# Patient Record
Sex: Male | Born: 1965 | Hispanic: Yes | Smoking: Never smoker
Health system: Southern US, Community
[De-identification: ages and names within clinical notes are randomized; demographics above are authoritative.]

## PROBLEM LIST (undated history)

## (undated) DIAGNOSIS — F250 Schizoaffective disorder, bipolar type: Secondary | ICD-10-CM

## (undated) DIAGNOSIS — F419 Anxiety disorder, unspecified: Secondary | ICD-10-CM

---

## 2015-09-21 ENCOUNTER — Emergency Department (HOSPITAL_COMMUNITY)
Admission: EM | Admit: 2015-09-21 | Discharge: 2015-09-23 | Disposition: A | Discharge status: Other Acute Inpatient Hospital | Payer: Federal, State, Local not specified - Other | Attending: Emergency Medicine | Admitting: Emergency Medicine

## 2015-09-21 ENCOUNTER — Emergency Department (HOSPITAL_COMMUNITY): Payer: Self-pay

## 2015-09-21 DIAGNOSIS — R740 Nonspecific elevation of levels of transaminase and lactic acid dehydrogenase [LDH]: Secondary | ICD-10-CM | POA: Insufficient documentation

## 2015-09-21 DIAGNOSIS — E876 Hypokalemia: Secondary | ICD-10-CM | POA: Insufficient documentation

## 2015-09-21 DIAGNOSIS — K8020 Calculus of gallbladder without cholecystitis without obstruction: Secondary | ICD-10-CM | POA: Insufficient documentation

## 2015-09-21 DIAGNOSIS — F250 Schizoaffective disorder, bipolar type: Secondary | ICD-10-CM | POA: Insufficient documentation

## 2015-09-21 DIAGNOSIS — I10 Essential (primary) hypertension: Secondary | ICD-10-CM | POA: Insufficient documentation

## 2015-09-21 DIAGNOSIS — Z79899 Other long term (current) drug therapy: Secondary | ICD-10-CM | POA: Insufficient documentation

## 2015-09-21 DIAGNOSIS — R7401 Elevation of levels of liver transaminase levels: Secondary | ICD-10-CM

## 2015-09-21 LAB — COMPREHENSIVE METABOLIC PANEL
ALT: 175 U/L — AB (ref 17–63)
AST: 197 U/L — AB (ref 15–41)
Albumin: 4 g/dL (ref 3.5–5.0)
Alkaline Phosphatase: 104 U/L (ref 38–126)
Anion gap: 11 (ref 5–15)
BILIRUBIN TOTAL: 1.8 mg/dL — AB (ref 0.3–1.2)
BUN: 12 mg/dL (ref 6–20)
CO2: 24 mmol/L (ref 22–32)
CREATININE: 1.1 mg/dL (ref 0.61–1.24)
Calcium: 9.4 mg/dL (ref 8.9–10.3)
Chloride: 101 mmol/L (ref 101–111)
GFR calc Af Amer: >60 mL/min (ref >60)
Glucose, Bld: 128 mg/dL — ABNORMAL HIGH (ref 65–99)
Potassium: 3.2 mmol/L — ABNORMAL LOW (ref 3.5–5.1)
Sodium: 136 mmol/L (ref 135–145)
TOTAL PROTEIN: 9.9 g/dL — AB (ref 6.5–8.1)

## 2015-09-21 LAB — CBC WITH DIFFERENTIAL/PLATELET
BASOS ABS: 0 10*3/uL (ref 0.0–0.1)
BASOS PCT: 0 %
EOS ABS: 0.3 10*3/uL (ref 0.0–0.7)
Eosinophils Relative: 2 %
HEMATOCRIT: 52.9 % — AB (ref 39.0–52.0)
Hemoglobin: 18.5 g/dL — ABNORMAL HIGH (ref 13.0–17.0)
LYMPHS ABS: 4.1 10*3/uL — AB (ref 0.7–4.0)
Lymphocytes Relative: 27 %
MCH: 32.8 pg (ref 26.0–34.0)
MCHC: 35 g/dL (ref 30.0–36.0)
MCV: 93.8 fL (ref 78.0–100.0)
MONOS PCT: 9 %
Monocytes Absolute: 1.4 10*3/uL — ABNORMAL HIGH (ref 0.1–1.0)
NEUTROS ABS: 9.4 10*3/uL — AB (ref 1.7–7.7)
Neutrophils Relative %: 62 %
Platelets: 277 10*3/uL (ref 150–400)
RBC: 5.64 MIL/uL (ref 4.22–5.81)
RDW: 12.9 % (ref 11.5–15.5)
WBC: 15.2 10*3/uL — ABNORMAL HIGH (ref 4.0–10.5)

## 2015-09-21 LAB — ACETAMINOPHEN LEVEL

## 2015-09-21 LAB — SALICYLATE LEVEL: Salicylate Lvl: <4 mg/dL (ref 2.8–30.0)

## 2015-09-21 LAB — ETHANOL

## 2015-09-21 MED ORDER — HALOPERIDOL LACTATE 5 MG/ML IJ SOLN
5.0000 mg | Freq: Once | INTRAMUSCULAR | Status: AC
  Administered 2015-09-21: 5 mg via INTRAMUSCULAR
  Filled 2015-09-21: qty 1

## 2015-09-21 MED ORDER — MAGNESIUM OXIDE 400 (241.3 MG) MG PO TABS
400.0000 mg | ORAL_TABLET | Freq: Once | ORAL | Status: AC
  Administered 2015-09-22: 400 mg via ORAL
  Filled 2015-09-21: qty 1

## 2015-09-21 MED ORDER — MAGNESIUM SULFATE 2 GM/50ML IV SOLN: 2.0000 g | Freq: Once | INTRAVENOUS | Status: DC

## 2015-09-21 MED ORDER — DIPHENHYDRAMINE HCL 50 MG/ML IJ SOLN
25.0000 mg | Freq: Once | INTRAMUSCULAR | Status: AC
  Administered 2015-09-21: 25 mg via INTRAVENOUS
  Filled 2015-09-21: qty 1

## 2015-09-21 MED ORDER — LORAZEPAM 2 MG/ML IJ SOLN
2.0000 mg | Freq: Once | INTRAMUSCULAR | Status: AC
  Administered 2015-09-21: 2 mg via INTRAMUSCULAR
  Filled 2015-09-21: qty 1

## 2015-09-21 MED ORDER — POTASSIUM CHLORIDE CRYS ER 20 MEQ PO TBCR
40.0000 meq | EXTENDED_RELEASE_TABLET | Freq: Once | ORAL | Status: AC
  Administered 2015-09-22: 40 meq via ORAL
  Filled 2015-09-21: qty 2

## 2015-09-21 NOTE — ED Provider Notes (Addendum)
[facility]-EM[facility]Y DEPT Provider Note   CSN: [id number] Arrival date & time: 09/21/15  1955     History   Chief Complaint Chief Complaint  Patient presents with  . Manic Behavior  . Paranoid    HPI Scott Manning is a 50 y.o. male.  HPI 50 year old male with past medical history of schizophrenia who presents with increasing aggressive behavior homicidal ideations and hallucinations. Patient is brought in by his wife she states that over the last several days patient has had increasing thoughts of homicidal ideation, aggressiveness, and hallucinations with responding to internal stimuli. She notified his psychiatrist who advised him to present to the emergency department for evaluation. [facility] and she brought him here for evaluation. Currently, the patient is unwilling or unable to provide further history due to acute agitation.  Remainder of history, ROS, and physical exam limited due to patient's AGITATION, AMS.   No past medical history on file.  There are no active problems to display for this patient.   No past surgical history on file.     Home Medications    Prior to Admission medications   Medication Sig Start Date End Date Taking? Authorizing Provider  DiphenhydrAMINE HCl (ZZZQUIL) 50 MG/30ML LIQD Take 50 mg by mouth at bedtime as needed (for sleep).   Yes Historical Provider, MD  divalproex (DEPAKOTE ER) 250 MG 24 hr tablet Take 250 mg by mouth 2 (two) times daily.   Yes Historical Provider, MD  OVER THE COUNTER MEDICATION Inject 1 application into the muscle every 30 (thirty) days.   Yes Historical Provider, MD  prazosin (MINIPRESS) 1 MG capsule Take 1 mg by mouth at bedtime.   Yes Historical Provider, MD    Family History No family history on file.  Social History Social History  Substance Use Topics  . Smoking status: Not on file  . Smokeless tobacco: Not on file  . Alcohol use Not on file     Allergies     Review of patient's allergies indicates not on file.   Review of Systems Review of Systems  Unable to perform ROS: Psychiatric disorder     Physical Exam Updated Vital Signs BP (!) 142/113 (BP Location: Right Arm)   Pulse 102   Temp 98.5 F (36.9 C) (Oral)   Resp 17   SpO2 94%   Physical Exam  Constitutional: He appears well-developed and well-nourished. He appears distressed.  HENT:  Head: Normocephalic and atraumatic.  Mouth/Throat: Oropharynx is clear and moist. No oropharyngeal exudate.  Eyes: Conjunctivae are normal. Pupils are equal, round, and reactive to light.  Neck: Neck supple.  Cardiovascular: Normal rate, regular rhythm and normal heart sounds.  Exam reveals no friction rub.   No murmur heard. Pulmonary/Chest: Effort normal and breath sounds normal. No respiratory distress. He has no wheezes. He has no rales.  Abdominal: Soft. He exhibits no distension. There is no tenderness.  Musculoskeletal: He exhibits no edema.  Neurological: He is alert. No sensory deficit. He exhibits normal muscle tone.  Face is symmetric. Extraocular movements appear intact. Strength is at least antigravity in bilateral upper and lower extremities. Gait is normal.  Skin: Skin is warm. Capillary refill takes less than 2 seconds. No pallor.  Psychiatric:  Agitated, cursing at staff and examiner. Occasionally he appears to respond to internal stimuli.  Nursing note and vitals reviewed.    ED Treatments / Results  Labs (all labs ordered are listed, but only abnormal  results are displayed) Labs Reviewed  COMPREHENSIVE METABOLIC PANEL - Abnormal; Notable for the following:       Result Value   Potassium 3.2 (*)    Glucose, Bld 128 (*)    Total Protein 9.9 (*)    AST 197 (*)    ALT 175 (*)    Total Bilirubin 1.8 (*)    All other components within normal limits  CBC WITH DIFFERENTIAL/PLATELET - Abnormal; Notable for the following:    WBC 15.2 (*)    Hemoglobin 18.5 (*)    HCT  52.9 (*)    Neutro Abs 9.4 (*)    Lymphs Abs 4.1 (*)    Monocytes Absolute 1.4 (*)    All other components within normal limits  ACETAMINOPHEN LEVEL - Abnormal; Notable for the following:    Acetaminophen (Tylenol), Serum <10 (*)    All other components within normal limits  VALPROIC ACID LEVEL - Abnormal; Notable for the following:    Valproic Acid Lvl <10 (*)    All other components within normal limits  ETHANOL  SALICYLATE LEVEL  URINE RAPID DRUG SCREEN, HOSP PERFORMED  URINALYSIS, ROUTINE W REFLEX MICROSCOPIC (NOT AT [facility])    EKG  EKG Interpretation  Date/Time:  Monday September 21 2015 21:16:41 EDT Ventricular Rate:  126 PR Interval:    QRS Duration: 74 QT Interval:  339 QTC Calculation: 491 R Axis:   43 Text Interpretation:  Sinus tachycardia Atrial premature complex Borderline prolonged QT interval No old tracing to compare Confirmed by Kamala Kolton MD, Sheria Lang [phone]) on 09/22/2015 3:56:11 AM       Radiology No results found.  Procedures Procedures (including critical care time)  Medications Ordered in ED Medications  potassium chloride SA (K-DUR,KLOR-CON) CR tablet 40 mEq (0 mEq Oral Hold 09/21/15 2300)  magnesium oxide (MAG-OX) tablet 400 mg (0 mg Oral Hold 09/21/15 2300)  haloperidol lactate (HALDOL) injection 5 mg (5 mg Intramuscular Given 09/21/15 2025)  diphenhydrAMINE (BENADRYL) injection 25 mg (25 mg Intravenous Given 09/21/15 2025)  LORazepam (ATIVAN) injection 2 mg (2 mg Intramuscular Given 09/21/15 2025)     Initial Impression / Assessment and Plan / ED Course  I have reviewed the triage vital signs and the nursing notes.  Pertinent labs & imaging results that were available during my care of the patient were reviewed by me and considered in my medical decision making (see chart for details).  Clinical Course    50 year old male with past medical history schizophrenia who presents with acute agitation and aggression and active hallucinations. On arrival,  patient is combative. He appears to be occasionally responding to internal stimuli. At this time, based on my interaction with the patient and discussion with the patient's wife, I'm concerned for acute psychiatric decompensation in setting of medication nonadherence with possible substance abuse. I believe he poses a significant risk of getting to himself and/or others and merits IVC paperwork. Otherwise, will send psychiatric screening labs. He has no focal neurological deficits or signs of head trauma.  Lab work reviewed. Mild leukocytosis is likely reactive in setting of aggression, agitation and restraint. No fevers or signs of sepsis. He has had no HA, neck stiffness, is o/w well appearing and I do not suspect meningitis or encephalitis. Otherwise, pt does have mild transaminitis - possibly 22 underlying liver disease, EtOH use. RUQ U/S obtained and is negative for cholecystitis and abdomen soft on my exam - likely incidental, will need f/u as outpt.  Otherwise, pt calm but continues hallucinating  on exam. VSS. He is hypertensive but unclear if this is due to underlying HTN versus agitation and mania - will need outpt follow-up but o/w medically stable for psych evaluation. TTS consult ordered.  Final Clinical Impressions(s) / ED Diagnoses   Final diagnoses:  Transaminitis  Hypokalemia      Shaune Pollack, MD 09/22/15 1236

## 2015-09-21 NOTE — ED Notes (Signed)
Pt making repeated statements "I want to die".

## 2015-09-21 NOTE — ED Notes (Addendum)
Pt arrived to room from triage, combative with staff, [employer] at bedside and have placed patient in handcuffs. MD Issac evaluated patient in triage, IVC paperwork completed and faxed. Pt assisted to bed, chemical restraints given, pt will be taken out of handcuffs and changed into hard restraints once patient is stable and not attempting to hit staff. Pt remains alert, cussing at staff, moving all extremities.

## 2015-09-22 ENCOUNTER — Inpatient Hospital Stay (HOSPITAL_COMMUNITY): Admission: AD | Admit: 2015-09-22 | Payer: Federal, State, Local not specified - Other | Admitting: Psychiatry

## 2015-09-22 LAB — VALPROIC ACID LEVEL: Valproic Acid Lvl: <10 ug/mL — ABNORMAL LOW (ref 50.0–100.0)

## 2015-09-22 MED ORDER — LORAZEPAM 1 MG PO TABS: 0.0000 mg | ORAL_TABLET | Freq: Two times a day (BID) | ORAL | Status: DC

## 2015-09-22 MED ORDER — LORAZEPAM 1 MG PO TABS
0.0000 mg | ORAL_TABLET | Freq: Four times a day (QID) | ORAL | Status: DC
  Administered 2015-09-23: 1 mg via ORAL
  Filled 2015-09-22: qty 1

## 2015-09-22 NOTE — ED Provider Notes (Signed)
[facility]. Discussed with Dr. Albina BilletGoldston   Lalah Durango, MD 09/22/15 1052

## 2015-09-22 NOTE — ED Provider Notes (Signed)
Patient alert pleasant cooperative asymptomatic. Denies shortness of breath or pain anywhere. [facility] Results for orders placed or performed during the hospital encounter of 09/21/15  Comprehensive metabolic panel  Result Value Ref Range   Sodium 136 135 - 145 mmol/L   Potassium 3.2 (L) 3.5 - 5.1 mmol/L   Chloride 101 101 - 111 mmol/L   CO2 24 22 - 32 mmol/L   Glucose, Bld 128 (H) 65 - 99 mg/dL   BUN 12 6 - 20 mg/dL   Creatinine, Ser 9.601.10 0.61 - 1.24 mg/dL   Calcium 9.4 8.9 - 45.410.3 mg/dL   Total Protein 9.9 (H) 6.5 - 8.1 g/dL   Albumin 4.0 3.5 - 5.0 g/dL   AST 098197 (H) 15 - 41 U/L   ALT 175 (H) 17 - 63 U/L   Alkaline Phosphatase 104 38 - 126 U/L   Total Bilirubin 1.8 (H) 0.3 - 1.2 mg/dL   GFR calc non Af Amer >60 >60 mL/min   GFR calc Af Amer >60 >60 mL/min   Anion gap 11 5 - 15  Ethanol  Result Value Ref Range   Alcohol, Ethyl (B) <5 <5 mg/dL  CBC with Diff  Result Value Ref Range   WBC 15.2 (H) 4.0 - 10.5 K/uL   RBC 5.64 4.22 - 5.81 MIL/uL   Hemoglobin 18.5 (H) 13.0 - 17.0 g/dL   HCT 11.952.9 (H) 14.739.0 - 82.952.0 %   MCV 93.8 78.0 - 100.0 fL   MCH 32.8 26.0 - 34.0 pg   MCHC 35.0 30.0 - 36.0 g/dL   RDW 56.212.9 13.011.5 - 86.515.5 %   Platelets 277 150 - 400 K/uL   Neutrophils Relative % 62 %   Lymphocytes Relative 27 %   Monocytes Relative 9 %   Eosinophils Relative 2 %   Basophils Relative 0 %   Neutro Abs 9.4 (H) 1.7 - 7.7 K/uL   Lymphs Abs 4.1 (H) 0.7 - 4.0 K/uL   Monocytes Absolute 1.4 (H) 0.1 - 1.0 K/uL   Eosinophils Absolute 0.3 0.0 - 0.7 K/uL   Basophils Absolute 0.0 0.0 - 0.1 K/uL   Smear Review MORPHOLOGY UNREMARKABLE   Salicylate level  Result Value Ref Range   Salicylate Lvl <4.0 2.8 - 30.0 mg/dL  Acetaminophen level  Result Value Ref Range   Acetaminophen (Tylenol), Serum <10 (L) 10 - 30 ug/mL  Valproic acid level  Result Value Ref Range   Valproic Acid Lvl <10 (L) 50.0 - 100.0 ug/mL   [place] Abdomen Limited Ruq  Result Date: 09/22/2015 CLINICAL  DATA:  Transaminitis EXAM: US ABDOMEN LIMITED - RIGHT UPPER QUADRANT COMPARISON:  None. FINDINGS: Gallbladder: There is cholelithiasis with large stones measuring up to 4.7 cm. Gallbladder wall thickness is normal. No pericholecystic fluid. A positive sonographic Eulah PontMurphy sign was not demonstrated by the sonographer. Common bile duct: Diameter: 3 mm, normal Liver: No focal liver lesions identified. There is increased hepatic echogenicity suggesting hepatic steatosis. IMPRESSION: 1. Cholelithiasis without other findings to suggest cholecystitis. 2. Hepatic steatosis. Electronically Signed   By: Deatra RobinsonKevin  Herman M.D.   On: 09/22/2015 00:25  Patient is medically cleared for psychiatric evaluation. Transient low pulse oximetry seen earlier felt to be factitious   Doug SouSam Sabine Tenenbaum, MD 09/22/15 1416

## 2015-09-22 NOTE — BH Assessment (Signed)
[facility] Assessment Progress Note  [facility]

## 2015-09-22 NOTE — ED Notes (Signed)
Patient lying on stretcher conversing calmly with [facility].

## 2015-09-22 NOTE — ED Notes (Signed)
Pt oriented to room and unit.  Patient is calm and cooperative.  Complains of Audio and visual hallucinations.  Wife is at bedside.  Manual blood pressure checked.  15 minute checks and video monitoring in place.

## 2015-09-22 NOTE — ED Notes (Signed)
Patient alert and verbal. Patient states he is here because he does not feel his medication is working and he is hearing voices but unable to state what they are saying. Patient reminded that a urine sample was need on next bathroom visit. Patient calm and cooperative. Family is visiting and attentive to care and needs. Patient denies SI/HI at this time. Q 15 minute checks in progress and maintained for safety. Patient remains safe on unit.
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
## 2015-09-22 NOTE — ED Notes (Signed)
Pts. [facility] contact [facility]  [phone]

## 2015-09-22 NOTE — Progress Notes (Signed)
09/22/15 1358:  LRT went to introduce self to pt and offer activities.  Pt was with visitor.   Caroll RancherMarjette Raja Caputi, LRT/CTRS

## 2015-09-23 DIAGNOSIS — F250 Schizoaffective disorder, bipolar type: Secondary | ICD-10-CM | POA: Diagnosis not present

## 2015-09-23 LAB — BASIC METABOLIC PANEL
Anion gap: 6 (ref 5–15)
BUN: 22 mg/dL — AB (ref 6–20)
CHLORIDE: 105 mmol/L (ref 101–111)
CO2: 28 mmol/L (ref 22–32)
CREATININE: 1.08 mg/dL (ref 0.61–1.24)
Calcium: 9.1 mg/dL (ref 8.9–10.3)
GFR calc Af Amer: >60 mL/min (ref >60)
GFR calc non Af Amer: >60 mL/min (ref >60)
GLUCOSE: 90 mg/dL (ref 65–99)
POTASSIUM: 4.5 mmol/L (ref 3.5–5.1)
SODIUM: 139 mmol/L (ref 135–145)

## 2015-09-23 LAB — MAGNESIUM: MAGNESIUM: 2.2 mg/dL (ref 1.7–2.4)

## 2015-09-23 MED ORDER — PRAZOSIN HCL 1 MG PO CAPS: 1.0000 mg | ORAL_CAPSULE | Freq: Every day | ORAL | 0 refills | Status: AC

## 2015-09-23 MED ORDER — PRAZOSIN HCL 1 MG PO CAPS: 1.0000 mg | ORAL_CAPSULE | Freq: Every day | ORAL | Status: DC

## 2015-09-23 MED ORDER — RISPERIDONE 1 MG PO TABS: 1.0000 mg | ORAL_TABLET | Freq: Every day | ORAL | Status: DC

## 2015-09-23 MED ORDER — DIVALPROEX SODIUM 500 MG PO DR TAB: 500.0000 mg | DELAYED_RELEASE_TABLET | Freq: Two times a day (BID) | ORAL | 0 refills | Status: AC

## 2015-09-23 MED ORDER — RISPERIDONE 1 MG PO TABS: 1.0000 mg | ORAL_TABLET | Freq: Every day | ORAL | 0 refills | Status: AC

## 2015-09-23 MED ORDER — DIVALPROEX SODIUM 500 MG PO DR TAB
500.0000 mg | DELAYED_RELEASE_TABLET | Freq: Two times a day (BID) | ORAL | Status: DC
  Administered 2015-09-23: 500 mg via ORAL
  Filled 2015-09-23: qty 1

## 2015-09-23 NOTE — Discharge Instructions (Signed)
Discharged to home

## 2015-09-23 NOTE — BH Assessment (Signed)
[facility] Assessment Progress Note  Change of commitment completed [place] [facility] 10:10 am

## 2015-09-23 NOTE — ED Notes (Signed)
phlebotomy called for AM lab draw
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
## 2015-09-23 NOTE — [facility] Suicide Risk Assessment (Signed)
Suicide Risk Assessment  Discharge Assessment   [facility]   Principal Problem: Schizoaffective disorder, bipolar type [facility](HCC) Discharge Diagnoses:  Patient Active Problem List   Diagnosis Date Noted  . Schizoaffective disorder, bipolar type ([facility]) [F25.0] 09/23/2015    Priority: High    Total Time spent with patient: 45 minutes  Musculoskeletal: Strength & Muscle Tone: within normal limits Gait & Station: normal Patient leans: N/A  Psychiatric Specialty Exam: Physical Exam  Constitutional: He is oriented to person, place, and time. He appears well-developed and well-nourished.  HENT:  Head: Normocephalic.  Neck: Normal range of motion.  Respiratory: Effort normal.  Musculoskeletal: Normal range of motion.  Neurological: He is alert and oriented to person, place, and time.  Skin: Skin is warm and dry.  Psychiatric: He has a normal mood and affect. His speech is normal and behavior is normal. Judgment and thought content normal. Cognition and memory are normal.    Review of Systems  Constitutional: Negative.   HENT: Negative.   Eyes: Negative.   Respiratory: Negative.   Cardiovascular: Negative.   Gastrointestinal: Negative.   Genitourinary: Negative.   Musculoskeletal: Negative.   Skin: Negative.   Neurological: Negative.   Endo/Heme/Allergies: Negative.   Psychiatric/Behavioral: Negative.     Blood pressure 141/89, pulse 81, temperature 98.6 F (37 C), temperature source Oral, resp. rate 20, SpO2 98 %.There is no height or weight on file to calculate BMI.  General Appearance: Casual  Eye Contact:  Good  Speech:  Normal Rate  Volume:  Normal  Mood:  Euthymic  Affect:  Congruent  Thought Process:  Coherent and Descriptions of Associations: Intact  Orientation:  Full (Time, Place, and Person)  Thought Content:  WDL  Suicidal Thoughts:  No  Homicidal Thoughts:  No  Memory:  Immediate;   Good Recent;   Good Remote;   Good  Judgement:   Fair  Insight:  Fair  Psychomotor Activity:  Normal  Concentration:  Concentration: Good and Attention Span: Good  Recall:  Good  Fund of Knowledge:  Good  Language:  Good  Akathisia:  No  Handed:  Right  AIMS (if indicated):     Assets:  Housing Intimacy Leisure Time Physical Health Resilience Social Support  ADL's:  Intact  Cognition:  WNL  Sleep:      Mental Status Per Nursing Assessment::   On Admission:   hallucinations   Demographic Factors:  Male  Loss Factors: NA  Historical Factors: NA  Risk Reduction Factors:   Sense of responsibility to family, Employed, Living with another person, especially a relative, Positive social support and Positive therapeutic relationship  Continued Clinical Symptoms:  None   Cognitive Features That Contribute To Risk:  None    Suicide Risk:  Minimal: No identifiable suicidal ideation.  Patients presenting with no risk factors but with morbid ruminations; may be classified as minimal risk based on the severity of the depressive symptoms    Plan Of Care/Follow-up recommendations:  Activity:  as tolerated Diet:  heart healthy diet  Fabyan Loughmiller, NP 09/23/2015, 10:12 AM

## 2015-09-23 NOTE — ED Notes (Signed)
Pt expressed readiness for discharge. Discharged instructions read to pt who verbalized understanding. All belongings returned to pt who signed for same. Denies SI/HI, is not delusional and not responding to internal stimuli. Escorted pt to the ED exit.

## 2016-03-11 ENCOUNTER — Emergency Department (HOSPITAL_COMMUNITY): Payer: Self-pay

## 2016-03-11 ENCOUNTER — Encounter (HOSPITAL_COMMUNITY): Payer: Self-pay | Admitting: Emergency Medicine

## 2016-03-11 ENCOUNTER — Inpatient Hospital Stay (HOSPITAL_COMMUNITY)
Admission: EM | Admit: 2016-03-11 | Discharge: 2016-03-15 | DRG: 419 | Disposition: A | Discharge status: Home / Self Care | Payer: Self-pay | Attending: Surgery | Admitting: Surgery

## 2016-03-11 DIAGNOSIS — K819 Cholecystitis, unspecified: Secondary | ICD-10-CM | POA: Diagnosis present

## 2016-03-11 DIAGNOSIS — K8000 Calculus of gallbladder with acute cholecystitis without obstruction: Principal | ICD-10-CM | POA: Diagnosis present

## 2016-03-11 DIAGNOSIS — K7469 Other cirrhosis of liver: Secondary | ICD-10-CM | POA: Diagnosis present

## 2016-03-11 DIAGNOSIS — K810 Acute cholecystitis: Secondary | ICD-10-CM

## 2016-03-11 DIAGNOSIS — F259 Schizoaffective disorder, unspecified: Secondary | ICD-10-CM | POA: Diagnosis present

## 2016-03-11 DIAGNOSIS — B1920 Unspecified viral hepatitis C without hepatic coma: Secondary | ICD-10-CM | POA: Diagnosis present

## 2016-03-11 DIAGNOSIS — R1011 Right upper quadrant pain: Secondary | ICD-10-CM

## 2016-03-11 HISTORY — DX: Schizoaffective disorder, bipolar type: F25.0

## 2016-03-11 HISTORY — DX: Anxiety disorder, unspecified: F41.9

## 2016-03-11 LAB — BASIC METABOLIC PANEL WITH GFR
Anion gap: 9 (ref 5–15)
BUN: 11 mg/dL (ref 6–20)
CO2: 26 mmol/L (ref 22–32)
Calcium: 9.3 mg/dL (ref 8.9–10.3)
Chloride: 102 mmol/L (ref 101–111)
Creatinine, Ser: 0.76 mg/dL (ref 0.61–1.24)
GFR calc Af Amer: >60 mL/min
GFR calc non Af Amer: >60 mL/min
Glucose, Bld: 106 mg/dL — ABNORMAL HIGH (ref 65–99)
Potassium: 4.7 mmol/L (ref 3.5–5.1)
Sodium: 137 mmol/L (ref 135–145)

## 2016-03-11 LAB — I-STAT TROPONIN, ED: Troponin i, poc: 0 ng/mL (ref 0.00–0.08)

## 2016-03-11 LAB — HEPATIC FUNCTION PANEL
ALT: 148 U/L — AB (ref 17–63)
AST: 156 U/L — AB (ref 15–41)
Albumin: 3.7 g/dL (ref 3.5–5.0)
Alkaline Phosphatase: 123 U/L (ref 38–126)
BILIRUBIN DIRECT: 0.3 mg/dL (ref 0.1–0.5)
BILIRUBIN INDIRECT: 0.9 mg/dL (ref 0.3–0.9)
BILIRUBIN TOTAL: 1.2 mg/dL (ref 0.3–1.2)
Total Protein: 8.7 g/dL — ABNORMAL HIGH (ref 6.5–8.1)

## 2016-03-11 LAB — CBC
HEMATOCRIT: 48.5 % (ref 39.0–52.0)
HEMOGLOBIN: 17.1 g/dL — AB (ref 13.0–17.0)
MCH: 32.4 pg (ref 26.0–34.0)
MCHC: 35.3 g/dL (ref 30.0–36.0)
MCV: 92 fL (ref 78.0–100.0)
Platelets: 179 10*3/uL (ref 150–400)
RBC: 5.27 MIL/uL (ref 4.22–5.81)
RDW: 13.1 % (ref 11.5–15.5)
WBC: 9 10*3/uL (ref 4.0–10.5)

## 2016-03-11 LAB — LIPASE, BLOOD: Lipase: 21 U/L (ref 11–51)

## 2016-03-11 MED ORDER — METHOCARBAMOL 500 MG PO TABS
500.0000 mg | ORAL_TABLET | Freq: Four times a day (QID) | ORAL | Status: DC | PRN
  Administered 2016-03-12 – 2016-03-14 (×5): 500 mg via ORAL
  Filled 2016-03-11 (×5): qty 1

## 2016-03-11 MED ORDER — HYDROMORPHONE HCL 2 MG/ML IJ SOLN
1.0000 mg | INTRAMUSCULAR | Status: DC | PRN
  Administered 2016-03-11 – 2016-03-13 (×7): 2 mg via INTRAVENOUS
  Administered 2016-03-13: 1 mg via INTRAVENOUS
  Administered 2016-03-13: 2 mg via INTRAVENOUS
  Filled 2016-03-11 (×9): qty 1

## 2016-03-11 MED ORDER — DEXTROSE 5 % IV SOLN
2.0000 g | Freq: Once | INTRAVENOUS | Status: AC
  Administered 2016-03-11: 2 g via INTRAVENOUS
  Filled 2016-03-11: qty 2

## 2016-03-11 MED ORDER — DEXTROSE 5 % IV SOLN
2.0000 g | INTRAVENOUS | Status: DC
  Administered 2016-03-12 – 2016-03-13 (×2): 2 g via INTRAVENOUS
  Filled 2016-03-11 (×3): qty 2

## 2016-03-11 MED ORDER — IBUPROFEN 600 MG PO TABS: 600.0000 mg | ORAL_TABLET | Freq: Four times a day (QID) | ORAL | Status: DC | PRN

## 2016-03-11 MED ORDER — HYDROMORPHONE HCL 2 MG/ML IJ SOLN
1.0000 mg | Freq: Once | INTRAMUSCULAR | Status: AC
  Administered 2016-03-11: 1 mg via INTRAVENOUS
  Filled 2016-03-11: qty 1

## 2016-03-11 MED ORDER — OXYCODONE HCL 5 MG PO TABS
5.0000 mg | ORAL_TABLET | ORAL | Status: DC | PRN
  Administered 2016-03-12 – 2016-03-14 (×3): 10 mg via ORAL
  Filled 2016-03-11 (×4): qty 2

## 2016-03-11 MED ORDER — DOCUSATE SODIUM 100 MG PO CAPS
100.0000 mg | ORAL_CAPSULE | Freq: Two times a day (BID) | ORAL | Status: DC
  Administered 2016-03-11 – 2016-03-15 (×7): 100 mg via ORAL
  Filled 2016-03-11 (×7): qty 1

## 2016-03-11 MED ORDER — HYDRALAZINE HCL 20 MG/ML IJ SOLN
10.0000 mg | INTRAMUSCULAR | Status: DC | PRN
  Administered 2016-03-12: 10 mg via INTRAVENOUS
  Filled 2016-03-11: qty 1

## 2016-03-11 MED ORDER — ONDANSETRON HCL 4 MG/2ML IJ SOLN
4.0000 mg | Freq: Four times a day (QID) | INTRAMUSCULAR | Status: DC | PRN
  Administered 2016-03-12: 4 mg via INTRAVENOUS
  Filled 2016-03-11: qty 2

## 2016-03-11 MED ORDER — ZOLPIDEM TARTRATE 5 MG PO TABS: 5.0000 mg | ORAL_TABLET | Freq: Every evening | ORAL | Status: DC | PRN

## 2016-03-11 MED ORDER — HYDROMORPHONE HCL 2 MG/ML IJ SOLN
1.0000 mg | INTRAMUSCULAR | Status: DC | PRN
  Administered 2016-03-11: 1 mg via INTRAVENOUS
  Filled 2016-03-11: qty 1

## 2016-03-11 MED ORDER — SODIUM CHLORIDE 0.9 % IV SOLN
INTRAVENOUS | Status: DC
  Administered 2016-03-11: 20:00:00 via INTRAVENOUS

## 2016-03-11 MED ORDER — ONDANSETRON 4 MG PO TBDP: 4.0000 mg | ORAL_TABLET | Freq: Four times a day (QID) | ORAL | Status: DC | PRN

## 2016-03-11 MED ORDER — SODIUM CHLORIDE 0.9 % IV BOLUS (SEPSIS)
1000.0000 mL | Freq: Once | INTRAVENOUS | Status: AC
  Administered 2016-03-11: 1000 mL via INTRAVENOUS

## 2016-03-11 MED ORDER — DIPHENHYDRAMINE HCL 50 MG/ML IJ SOLN: 25.0000 mg | Freq: Four times a day (QID) | INTRAMUSCULAR | Status: DC | PRN

## 2016-03-11 MED ORDER — DIPHENHYDRAMINE HCL 25 MG PO CAPS: 25.0000 mg | ORAL_CAPSULE | Freq: Four times a day (QID) | ORAL | Status: DC | PRN

## 2016-03-11 MED ORDER — HYDROMORPHONE HCL 2 MG/ML IJ SOLN
2.0000 mg | Freq: Once | INTRAMUSCULAR | Status: AC
  Administered 2016-03-11: 2 mg via INTRAVENOUS
  Filled 2016-03-11: qty 1

## 2016-03-11 MED ORDER — ENOXAPARIN SODIUM 40 MG/0.4ML ~~LOC~~ SOLN
40.0000 mg | SUBCUTANEOUS | Status: DC
  Administered 2016-03-11 – 2016-03-14 (×4): 40 mg via SUBCUTANEOUS
  Filled 2016-03-11 (×4): qty 0.4

## 2016-03-11 NOTE — ED Triage Notes (Addendum)
Pt states he started having central CP and right upper abd pain. Pt states he has some nausea. Pt's father died of MI when he was in his 6050's.

## 2016-03-11 NOTE — ED Provider Notes (Signed)
[facility]-EMERGENCY DEPT Provider Note   CSN: [id number] Arrival date & time: 03/11/16  1238     History   Chief Complaint Chief Complaint  Patient presents with  . Chest Pain    HPI Scott Manning is a 51 y.o. male.  HPI 51 y.o. male with a hx of Schizoaffective Disorder, Hepatitis C, presents to the Emergency Department today complaining of centralized chest pain as well as RUQ pain that started around 0300 this morning. Notes constant pain that is isolated to RUQ into chest. No hx same. Notes cramping sensation and rates pain 10/10. No emesis. No diarrhea. No fevers. Notes nausea. No numbness/tingling to BUE or BLE. No hx ACS. Does not father with hx ACS in 30s. No other symptoms noted.   History reviewed. No pertinent past medical history.  Patient Active Problem List   Diagnosis Date Noted  . Schizoaffective disorder, bipolar type ([facility]) 09/23/2015    History reviewed. No pertinent surgical history.   Home Medications    Prior to Admission medications   Medication Sig Start Date End Date Taking? Authorizing Provider  DiphenhydrAMINE HCl (ZZZQUIL) 50 MG/30ML LIQD Take 50 mg by mouth at bedtime as needed (for sleep).    Historical Provider, MD  divalproex (DEPAKOTE ER) 250 MG 24 hr tablet Take 250 mg by mouth 2 (two) times daily.    Historical Provider, MD  divalproex (DEPAKOTE) 500 MG DR tablet Take 1 tablet (500 mg total) by mouth every 12 (twelve) hours. 09/23/15   Charm Rings, NP  OVER THE COUNTER MEDICATION Inject 1 application into the muscle every 30 (thirty) days.    Historical Provider, MD  prazosin (MINIPRESS) 1 MG capsule Take 1 mg by mouth at bedtime.    Historical Provider, MD  prazosin (MINIPRESS) 1 MG capsule Take 1 capsule (1 mg total) by mouth at bedtime. 09/23/15   Charm Rings, NP  risperiDONE (RISPERDAL) 1 MG tablet Take 1 tablet (1 mg total) by mouth at bedtime. 09/23/15   Charm Rings, NP    Family History History reviewed. No pertinent family  history.  Social History Social History  Substance Use Topics  . Smoking status: Never Smoker  . Smokeless tobacco: Never Used  . Alcohol use No     Allergies   Patient has no known allergies.   Review of Systems Review of Systems ROS reviewed and all are negative for acute change except as noted in the HPI.  Physical Exam Updated Vital Signs BP (!) 167/103 (BP Location: Right Arm)   Pulse 77   Temp 98 F (36.7 C) (Oral)   Resp 25   Ht 5\' 10"  (1.778 m)   Wt 122.5 kg   SpO2 99%   BMI 38.74 kg/m   Physical Exam  Constitutional: He is oriented to person, place, and time. Vital signs are normal. He appears well-developed and well-nourished.  Appears uncomfortable   HENT:  Head: Normocephalic and atraumatic.  Right Ear: Hearing normal.  Left Ear: Hearing normal.  Eyes: Conjunctivae and EOM are normal. Pupils are equal, round, and reactive to light.  Mild scleral icterus  Neck: Normal range of motion. Neck supple.  Cardiovascular: Normal rate and regular rhythm.   Pulmonary/Chest: Effort normal.  Abdominal: Soft. Normal appearance and bowel sounds are normal. There is tenderness in the right upper quadrant. There is positive Murphy's sign. There is no rigidity, no rebound, no guarding, no CVA tenderness and no tenderness at McBurney's point.  Abdomen soft  Neurological: He  is alert and oriented to person, place, and time.  Skin: Skin is warm and dry.  Jaundice noted  Psychiatric: He has a normal mood and affect. His speech is normal and behavior is normal. Thought content normal.  Nursing note and vitals reviewed.  ED Treatments / Results  Labs (all labs ordered are listed, but only abnormal results are displayed) Labs Reviewed  BASIC METABOLIC PANEL - Abnormal; Notable for the following:       Result Value   Glucose, Bld 106 (*)    All other components within normal limits  CBC - Abnormal; Notable for the following:    Hemoglobin 17.1 (*)    All other  components within normal limits  HEPATIC FUNCTION PANEL - Abnormal; Notable for the following:    Total Protein 8.7 (*)    AST 156 (*)    ALT 148 (*)    All other components within normal limits  LIPASE, BLOOD  I-STAT TROPOININ, ED    EKG  EKG Interpretation None       Radiology Dg Chest 2 View  Result Date: 03/11/2016 CLINICAL DATA:  Chest pain. EXAM: CHEST  2 VIEW COMPARISON:  No recent prior. FINDINGS: Mediastinum and hilar structures normal. Low lung volumes with mild basilar atelectasis. No pleural effusion or pneumothorax. No acute bony abnormality . IMPRESSION: No acute cardiopulmonary disease. Low lung volumes with mild basilar atelectasis . Electronically Signed   By: Maisie Fus  Register   On: 03/11/2016 13:32   US Abdomen Limited Ruq  Result Date: 03/11/2016 CLINICAL DATA:  Right upper quadrant abdominal pain since last night. EXAM: US ABDOMEN LIMITED - RIGHT UPPER QUADRANT COMPARISON:  09/21/2015 FINDINGS: Gallbladder: Gallstone in the gallbladder neck that was not mobile. Stone measures several cm in size. Positive Murphy sign. Gallbladder is distended. Common bile duct: Diameter: 5.2 mm, within normal limits. Liver: No focal lesion identified. Within normal limits in parenchymal echogenicity. IMPRESSION: Large gallstone in the gallbladder neck which did not move. Positive Murphy sign. Findings worrisome for cholecystitis. Electronically Signed   By: Paulina Fusi M.D.   On: 03/11/2016 16:22    Procedures Procedures (including critical care time)  Medications Ordered in ED Medications  sodium chloride 0.9 % bolus 1,000 mL (0 mLs Intravenous Stopped 03/11/16 1644)  HYDROmorphone (DILAUDID) injection 1 mg (1 mg Intravenous Given 03/11/16 1523)  HYDROmorphone (DILAUDID) injection 1 mg (1 mg Intravenous Given 03/11/16 1653)   Initial Impression / Assessment and Plan / ED Course  I have reviewed the triage vital signs and the nursing notes.  Pertinent labs & imaging results that  were available during my care of the patient were reviewed by me and considered in my medical decision making (see chart for details).  Final Clinical Impressions(s) / ED Diagnoses  {I have reviewed and evaluated the relevant laboratory values. {I have reviewed and evaluated the relevant imaging studies.  {I have reviewed the relevant previous healthcare records.  {I obtained HPI from historian. {Patient discussed with supervising physician.  ED Course:  Assessment: Pt is a 51 y.o. male with hx Hepc C and Schizoaffective Disorder who presents with RUQ pain since 0300. Constant and cramping sensation. No PO intake since pain onset. No hx same. On exam, pt in NAD. Nontoxic/nonseptic appearing. VSS. Afebrile. Lungs CTA. Heart RRR. Abdomen TTP RUQ. Jaundice noted on skin. CBC unremarkable. BMP unremarkable. Added LFTs due to jaundice which were mildly elevated. Lipase unremarkable. Concern for cholecystitis 2/2 biliary obstruction. RUQ US showed large gallstone in gallbladder  neck suggestive of acute cholecystitis. Given Rocephin. Given analgesia in ED. Consult to General Surgery will see in ED. Plan is to Admit to medicine.   Disposition/Plan:  Admit Pt acknowledges and agrees with plan  Supervising Physician Gwyneth SproutWhitney Plunkett, MD  Final diagnoses:  RUQ pain  Acute cholecystitis    New Prescriptions New Prescriptions   No medications on file     Audry Piliyler Gilberta Peeters, PA-C 03/11/16 1704    Gwyneth SproutWhitney Plunkett, MD 03/13/16 2326

## 2016-03-11 NOTE — Progress Notes (Signed)
Surgical H&P  CC: Abdominal pain  HPI: This is a 51 year old gentleman with a history of schizoaffective disorder, hepatitis C without known sequelae who presents with acute onset right upper quadrant abdominal pain which began around 3:00 in the morning today. The pain is associated cramping quality and quite severe, it radiates into his epigastrium/chest. He denies nausea/ emesis, denies constipation or diarrhea, denies fevers.   No Known Allergies  PMH: Hepatitis C, denies history of cirrhosis, schizoaffective disorder. Has not been on any medications for over a month.  History reviewed. No pertinent surgical history.  History reviewed. No pertinent family history.  Social History   Social History  . Marital status: Married    Spouse name: N/A  . Number of children: N/A  . Years of education: N/A   Social History Main Topics  . Smoking status: Never Smoker  . Smokeless tobacco: Never Used  . Alcohol use No  . Drug use: No  . Sexual activity: Not Asked   Other Topics Concern  . None   Social History Narrative  . None    No current facility-administered medications on file prior to encounter.    Current Outpatient Prescriptions on File Prior to Encounter  Medication Sig Dispense Refill  . divalproex (DEPAKOTE) 500 MG DR tablet Take 1 tablet (500 mg total) by mouth every 12 (twelve) hours. (Patient not taking: Reported on 03/11/2016) 60 tablet 0  . prazosin (MINIPRESS) 1 MG capsule Take 1 capsule (1 mg total) by mouth at bedtime. (Patient not taking: Reported on 03/11/2016) 30 capsule 0  . risperiDONE (RISPERDAL) 1 MG tablet Take 1 tablet (1 mg total) by mouth at bedtime. (Patient not taking: Reported on 03/11/2016) 30 tablet 0    Review of Systems: a complete, 10pt review of systems was completed with pertinent positives and negatives as documented in the HPI.   Physical Exam: Vitals:   03/11/16 1530 03/11/16 1700  BP: 150/92 152/91  Pulse: 74 70  Resp: 22 20  Temp:      Gen: A&Ox3, Uncomfortable appearing but no acute distress Head: normocephalic, atraumatic, EOMI, anicteric.  Neck: supple without mass or thyromegaly Chest: unlabored respirations, symmetrical air entry Cardiovascular: RRR with palpable distal pulses, no pedal edema Abdomen: Obese, tender in the right upper quadrant and mildly tender in the epigastrium. Voluntary guarding but no signs of peritonitis Extremities: warm, without edema, no deformities Neuro: grossly intact Psych: appropriate mood and affect Skin: No lesions or rashes a limited skin exam   CBC Latest Ref Rng & Units 03/11/2016 09/21/2015  WBC 4.0 - 10.5 K/uL 9.0 15.2(H)  Hemoglobin 13.0 - 17.0 g/dL 17.1(H) 18.5(H)  Hematocrit 39.0 - 52.0 % 48.5 52.9(H)  Platelets 150 - 400 K/uL 179 277    CMP Latest Ref Rng & Units 03/11/2016 09/23/2015 09/21/2015  Glucose 65 - 99 mg/dL 161(W106(H) 90 960(A128(H)  BUN 6 - 20 mg/dL 11 54(U22(H) 12  Creatinine 0.61 - 1.24 mg/dL 9.810.76 1.911.08 4.781.10  Sodium 135 - 145 mmol/L 137 139 136  Potassium 3.5 - 5.1 mmol/L 4.7 4.5 3.2(L)  Chloride 101 - 111 mmol/L 102 105 101  CO2 22 - 32 mmol/L 26 28 24   Calcium 8.9 - 10.3 mg/dL 9.3 9.1 9.4  Total Protein 6.5 - 8.1 g/dL 2.9(F8.7(H) - 9.9(H)  Total Bilirubin 0.3 - 1.2 mg/dL 1.2 - 1.8(H)  Alkaline Phos 38 - 126 U/L 123 - 104  AST 15 - 41 U/L 156(H) - 197(H)  ALT 17 - 63 U/L 148(H) -  175(H)    No results found for: INR, PROTIME  Imaging: CLINICAL DATA:  Right upper quadrant abdominal pain since last night.  EXAM: US ABDOMEN LIMITED - RIGHT UPPER QUADRANT  COMPARISON:  09/21/2015  FINDINGS: Gallbladder:  Gallstone in the gallbladder neck that was not mobile. Stone measures several cm in size. Positive Murphy sign. Gallbladder is distended.  Common bile duct:  Diameter: 5.2 mm, within normal limits.  Liver:  No focal lesion identified. Within normal limits in parenchymal echogenicity.  IMPRESSION: Large gallstone in the gallbladder neck which  did not move. Positive Murphy sign. Findings worrisome for cholecystitis.  A/P: 51 year old gentleman with likely early cholecystitis and a large non-mobile stone. Will admit for pain control and IV antibiotics with likely laparoscopic cholecystectomy in the next 1-2 days.   Phylliss Blakes, MD [facility], [place] Pager [phone]

## 2016-03-11 NOTE — ED Notes (Signed)
Attempted report x1. 

## 2016-03-12 ENCOUNTER — Observation Stay (HOSPITAL_COMMUNITY): Payer: Self-pay | Admitting: Certified Registered Nurse Anesthetist

## 2016-03-12 ENCOUNTER — Encounter (HOSPITAL_COMMUNITY): Admission: EM | Disposition: A | Discharge status: Home / Self Care | Payer: Self-pay | Source: Home / Self Care

## 2016-03-12 HISTORY — PX: LAPAROSCOPIC CHOLECYSTECTOMY SINGLE SITE WITH INTRAOPERATIVE CHOLANGIOGRAM: SHX6538

## 2016-03-12 LAB — COMPREHENSIVE METABOLIC PANEL
ALBUMIN: 3.3 g/dL — AB (ref 3.5–5.0)
ALK PHOS: 94 U/L (ref 38–126)
ALT: 125 U/L — AB (ref 17–63)
AST: 122 U/L — ABNORMAL HIGH (ref 15–41)
Anion gap: 9 (ref 5–15)
BILIRUBIN TOTAL: 1.2 mg/dL (ref 0.3–1.2)
BUN: 10 mg/dL (ref 6–20)
CALCIUM: 8.9 mg/dL (ref 8.9–10.3)
CO2: 25 mmol/L (ref 22–32)
CREATININE: 0.72 mg/dL (ref 0.61–1.24)
Chloride: 105 mmol/L (ref 101–111)
GFR calc Af Amer: >60 mL/min (ref >60)
GFR calc non Af Amer: >60 mL/min (ref >60)
GLUCOSE: 120 mg/dL — AB (ref 65–99)
Potassium: 4.8 mmol/L (ref 3.5–5.1)
SODIUM: 139 mmol/L (ref 135–145)
Total Protein: 8.1 g/dL (ref 6.5–8.1)

## 2016-03-12 LAB — CBC
HCT: 45.8 % (ref 39.0–52.0)
Hemoglobin: 15.5 g/dL (ref 13.0–17.0)
MCH: 31.8 pg (ref 26.0–34.0)
MCHC: 33.8 g/dL (ref 30.0–36.0)
MCV: 93.9 fL (ref 78.0–100.0)
PLATELETS: 159 10*3/uL (ref 150–400)
RBC: 4.88 MIL/uL (ref 4.22–5.81)
RDW: 13.5 % (ref 11.5–15.5)
WBC: 11.8 10*3/uL — ABNORMAL HIGH (ref 4.0–10.5)

## 2016-03-12 LAB — PROTIME-INR
INR: 1.11
Prothrombin Time: 14.4 seconds (ref 11.4–15.2)

## 2016-03-12 LAB — SURGICAL PCR SCREEN
MRSA, PCR: NEGATIVE
STAPHYLOCOCCUS AUREUS: POSITIVE — AB

## 2016-03-12 LAB — APTT: APTT: 32 s (ref 24–36)

## 2016-03-12 LAB — HIV ANTIBODY (ROUTINE TESTING W REFLEX): HIV Screen 4th Generation wRfx: NONREACTIVE

## 2016-03-12 SURGERY — LAPAROSCOPIC CHOLECYSTECTOMY SINGLE SITE WITH INTRAOPERATIVE CHOLANGIOGRAM
Anesthesia: General | Site: Abdomen

## 2016-03-12 MED ORDER — ONDANSETRON HCL 4 MG/2ML IJ SOLN: 4.0000 mg | Freq: Four times a day (QID) | INTRAMUSCULAR | Status: DC | PRN

## 2016-03-12 MED ORDER — RISPERIDONE 1 MG PO TABS
1.0000 mg | ORAL_TABLET | Freq: Every day | ORAL | Status: DC
  Administered 2016-03-13 – 2016-03-14 (×2): 1 mg via ORAL
  Filled 2016-03-12 (×3): qty 1

## 2016-03-12 MED ORDER — BUPIVACAINE-EPINEPHRINE 0.25% -1:200000 IJ SOLN
INTRAMUSCULAR | Status: DC | PRN
  Administered 2016-03-12: 10 mL

## 2016-03-12 MED ORDER — FENTANYL CITRATE (PF) 100 MCG/2ML IJ SOLN
INTRAMUSCULAR | Status: AC
  Filled 2016-03-12: qty 2

## 2016-03-12 MED ORDER — MIDAZOLAM HCL 5 MG/5ML IJ SOLN
INTRAMUSCULAR | Status: DC | PRN
  Administered 2016-03-12: 2 mg via INTRAVENOUS

## 2016-03-12 MED ORDER — PRAZOSIN HCL 1 MG PO CAPS
1.0000 mg | ORAL_CAPSULE | Freq: Every day | ORAL | Status: DC
  Administered 2016-03-13 – 2016-03-14 (×2): 1 mg via ORAL
  Filled 2016-03-12 (×3): qty 1

## 2016-03-12 MED ORDER — PHENYLEPHRINE 40 MCG/ML (10ML) SYRINGE FOR IV PUSH (FOR BLOOD PRESSURE SUPPORT)
PREFILLED_SYRINGE | INTRAVENOUS | Status: AC
  Filled 2016-03-12: qty 10

## 2016-03-12 MED ORDER — LIDOCAINE 2% (20 MG/ML) 5 ML SYRINGE
INTRAMUSCULAR | Status: AC
  Filled 2016-03-12: qty 10

## 2016-03-12 MED ORDER — ROCURONIUM BROMIDE 10 MG/ML (PF) SYRINGE
PREFILLED_SYRINGE | INTRAVENOUS | Status: DC | PRN
  Administered 2016-03-12: 50 mg via INTRAVENOUS
  Administered 2016-03-12 (×4): 10 mg via INTRAVENOUS

## 2016-03-12 MED ORDER — ONDANSETRON HCL 4 MG/2ML IJ SOLN
INTRAMUSCULAR | Status: DC | PRN
  Administered 2016-03-12: 4 mg via INTRAVENOUS

## 2016-03-12 MED ORDER — OXYCODONE HCL 5 MG/5ML PO SOLN: 5.0000 mg | Freq: Once | ORAL | Status: DC | PRN

## 2016-03-12 MED ORDER — LACTATED RINGERS IV SOLN
INTRAVENOUS | Status: DC | PRN
  Administered 2016-03-12 (×2): via INTRAVENOUS

## 2016-03-12 MED ORDER — FENTANYL CITRATE (PF) 100 MCG/2ML IJ SOLN: 25.0000 ug | INTRAMUSCULAR | Status: DC | PRN

## 2016-03-12 MED ORDER — SUGAMMADEX SODIUM 200 MG/2ML IV SOLN
INTRAVENOUS | Status: AC
  Filled 2016-03-12: qty 4

## 2016-03-12 MED ORDER — OXYCODONE HCL 5 MG PO TABS: 5.0000 mg | ORAL_TABLET | Freq: Once | ORAL | Status: DC | PRN

## 2016-03-12 MED ORDER — SODIUM CHLORIDE 0.9 % IV SOLN
INTRAVENOUS | Status: DC
  Administered 2016-03-12 – 2016-03-14 (×4): via INTRAVENOUS

## 2016-03-12 MED ORDER — BUPIVACAINE HCL (PF) 0.25 % IJ SOLN
INTRAMUSCULAR | Status: AC
  Filled 2016-03-12: qty 30

## 2016-03-12 MED ORDER — ROCURONIUM BROMIDE 50 MG/5ML IV SOSY
PREFILLED_SYRINGE | INTRAVENOUS | Status: AC
  Filled 2016-03-12: qty 10

## 2016-03-12 MED ORDER — OXYCODONE-ACETAMINOPHEN 5-325 MG PO TABS
1.0000 | ORAL_TABLET | ORAL | Status: DC | PRN
  Administered 2016-03-12 – 2016-03-15 (×6): 2 via ORAL
  Filled 2016-03-12 (×6): qty 2

## 2016-03-12 MED ORDER — PHENYLEPHRINE 40 MCG/ML (10ML) SYRINGE FOR IV PUSH (FOR BLOOD PRESSURE SUPPORT)
PREFILLED_SYRINGE | INTRAVENOUS | Status: DC | PRN
  Administered 2016-03-12 (×2): 80 ug via INTRAVENOUS
  Administered 2016-03-12: 40 ug via INTRAVENOUS
  Administered 2016-03-12: 120 ug via INTRAVENOUS
  Administered 2016-03-12: 80 ug via INTRAVENOUS

## 2016-03-12 MED ORDER — DEXMEDETOMIDINE HCL IN NACL 200 MCG/50ML IV SOLN
INTRAVENOUS | Status: AC
  Filled 2016-03-12: qty 50

## 2016-03-12 MED ORDER — PROPOFOL 10 MG/ML IV BOLUS
INTRAVENOUS | Status: DC | PRN
  Administered 2016-03-12: 200 mg via INTRAVENOUS

## 2016-03-12 MED ORDER — HYDROMORPHONE HCL 1 MG/ML IJ SOLN: 0.2500 mg | INTRAMUSCULAR | Status: DC | PRN

## 2016-03-12 MED ORDER — PHENYLEPHRINE HCL 10 MG/ML IJ SOLN
INTRAVENOUS | Status: DC | PRN
  Administered 2016-03-12: 20 ug/min via INTRAVENOUS

## 2016-03-12 MED ORDER — PROPOFOL 10 MG/ML IV BOLUS
INTRAVENOUS | Status: AC
Start: 2016-03-12 — End: 2016-03-12
  Filled 2016-03-12: qty 40

## 2016-03-12 MED ORDER — DEXMEDETOMIDINE HCL 200 MCG/2ML IV SOLN
INTRAVENOUS | Status: DC | PRN
  Administered 2016-03-12 (×2): 8 ug via INTRAVENOUS

## 2016-03-12 MED ORDER — LACTATED RINGERS IV SOLN
INTRAVENOUS | Status: DC
  Administered 2016-03-12 (×2): via INTRAVENOUS

## 2016-03-12 MED ORDER — SUGAMMADEX SODIUM 200 MG/2ML IV SOLN
INTRAVENOUS | Status: DC | PRN
  Administered 2016-03-12: 250 mg via INTRAVENOUS

## 2016-03-12 MED ORDER — SODIUM CHLORIDE 0.9 % IR SOLN
Status: DC | PRN
  Administered 2016-03-12: 3000 mL

## 2016-03-12 MED ORDER — DEXAMETHASONE SODIUM PHOSPHATE 10 MG/ML IJ SOLN
INTRAMUSCULAR | Status: DC | PRN
  Administered 2016-03-12: 10 mg via INTRAVENOUS

## 2016-03-12 MED ORDER — DEXAMETHASONE SODIUM PHOSPHATE 10 MG/ML IJ SOLN
INTRAMUSCULAR | Status: AC
  Filled 2016-03-12: qty 1

## 2016-03-12 MED ORDER — PHENYLEPHRINE HCL 10 MG/ML IJ SOLN
INTRAMUSCULAR | Status: AC
  Filled 2016-03-12: qty 1

## 2016-03-12 MED ORDER — LIDOCAINE 2% (20 MG/ML) 5 ML SYRINGE
INTRAMUSCULAR | Status: DC | PRN
  Administered 2016-03-12: 50 mg via INTRAVENOUS

## 2016-03-12 MED ORDER — MIDAZOLAM HCL 2 MG/2ML IJ SOLN
INTRAMUSCULAR | Status: AC
  Filled 2016-03-12: qty 2

## 2016-03-12 MED ORDER — ONDANSETRON HCL 4 MG/2ML IJ SOLN
INTRAMUSCULAR | Status: AC
  Filled 2016-03-12: qty 4

## 2016-03-12 MED ORDER — DIVALPROEX SODIUM 500 MG PO DR TAB
500.0000 mg | DELAYED_RELEASE_TABLET | Freq: Two times a day (BID) | ORAL | Status: DC
  Administered 2016-03-13 – 2016-03-15 (×5): 500 mg via ORAL
  Filled 2016-03-12 (×7): qty 1

## 2016-03-12 MED ORDER — IOPAMIDOL (ISOVUE-300) INJECTION 61%
INTRAVENOUS | Status: AC
  Filled 2016-03-12: qty 50

## 2016-03-12 MED ORDER — HEMOSTATIC AGENTS (NO CHARGE) OPTIME
TOPICAL | Status: DC | PRN
  Administered 2016-03-12: 2 via TOPICAL

## 2016-03-12 MED ORDER — CEFAZOLIN SODIUM 1 G IJ SOLR
INTRAMUSCULAR | Status: DC | PRN
  Administered 2016-03-12: 3 g via INTRAMUSCULAR

## 2016-03-12 MED ORDER — 0.9 % SODIUM CHLORIDE (POUR BTL) OPTIME
TOPICAL | Status: DC | PRN
  Administered 2016-03-12: 1000 mL

## 2016-03-12 MED ORDER — FENTANYL CITRATE (PF) 100 MCG/2ML IJ SOLN
INTRAMUSCULAR | Status: DC | PRN
  Administered 2016-03-12 (×6): 50 ug via INTRAVENOUS

## 2016-03-12 MED ORDER — FENTANYL CITRATE (PF) 100 MCG/2ML IJ SOLN
INTRAMUSCULAR | Status: AC
  Filled 2016-03-12: qty 4

## 2016-03-12 SURGICAL SUPPLY — 48 items
APPLIER CLIP 5 13 M/L LIGAMAX5 (MISCELLANEOUS) ×6
BIOPATCH RED 1 DISK 7.0 (GAUZE/BANDAGES/DRESSINGS) ×2 IMPLANT
BIOPATCH RED 1IN DISK 7.0MM (GAUZE/BANDAGES/DRESSINGS) ×1
BLADE CLIPPER SURG (BLADE) IMPLANT
CANISTER SUCT 3000ML PPV (MISCELLANEOUS) ×3 IMPLANT
CHLORAPREP W/TINT 26ML (MISCELLANEOUS) ×3 IMPLANT
CLIP APPLIE 5 13 M/L LIGAMAX5 (MISCELLANEOUS) ×2 IMPLANT
CLOSURE STERI-STRIP 1/2X4 (GAUZE/BANDAGES/DRESSINGS) ×1
CLOSURE WOUND 1/2 X4 (GAUZE/BANDAGES/DRESSINGS) ×1
CLSR STERI-STRIP ANTIMIC 1/2X4 (GAUZE/BANDAGES/DRESSINGS) ×2 IMPLANT
COVER MAYO STAND STRL (DRAPES) ×3 IMPLANT
COVER SURGICAL LIGHT HANDLE (MISCELLANEOUS) ×3 IMPLANT
DERMABOND ADVANCED (GAUZE/BANDAGES/DRESSINGS) ×2
DERMABOND ADVANCED .7 DNX12 (GAUZE/BANDAGES/DRESSINGS) ×1 IMPLANT
DEVICE TROCAR PUNCTURE CLOSURE (ENDOMECHANICALS) ×3 IMPLANT
DRAIN CHANNEL 19F RND (DRAIN) ×3 IMPLANT
DRAPE C-ARM 42X72 X-RAY (DRAPES) ×3 IMPLANT
DRSG TEGADERM 4X4.75 (GAUZE/BANDAGES/DRESSINGS) ×3 IMPLANT
ELECT REM PT RETURN 9FT ADLT (ELECTROSURGICAL) ×3
ELECTRODE REM PT RTRN 9FT ADLT (ELECTROSURGICAL) ×1 IMPLANT
EVACUATOR 1/8 PVC DRAIN (DRAIN) ×3 IMPLANT
GLOVE BIO SURGEON STRL SZ7 (GLOVE) ×3 IMPLANT
GLOVE BIOGEL PI IND STRL 7.5 (GLOVE) ×1 IMPLANT
GLOVE BIOGEL PI INDICATOR 7.5 (GLOVE) ×2
GOWN STRL REUS W/ TWL LRG LVL3 (GOWN DISPOSABLE) ×3 IMPLANT
GOWN STRL REUS W/TWL LRG LVL3 (GOWN DISPOSABLE) ×6
KIT BASIN OR (CUSTOM PROCEDURE TRAY) ×3 IMPLANT
KIT ROOM TURNOVER OR (KITS) ×3 IMPLANT
NS IRRIG 1000ML POUR BTL (IV SOLUTION) ×3 IMPLANT
PAD ARMBOARD 7.5X6 YLW CONV (MISCELLANEOUS) ×3 IMPLANT
POUCH RETRIEVAL ECOSAC 10 (ENDOMECHANICALS) ×1 IMPLANT
POUCH RETRIEVAL ECOSAC 10MM (ENDOMECHANICALS) ×2
SCISSORS LAP 5X35 DISP (ENDOMECHANICALS) ×3 IMPLANT
SET CHOLANGIOGRAPH 5 50 .035 (SET/KITS/TRAYS/PACK) ×3 IMPLANT
SET IRRIG TUBING LAPAROSCOPIC (IRRIGATION / IRRIGATOR) ×3 IMPLANT
SLEEVE ENDOPATH XCEL 5M (ENDOMECHANICALS) ×6 IMPLANT
SPECIMEN JAR SMALL (MISCELLANEOUS) ×3 IMPLANT
STRIP CLOSURE SKIN 1/2X4 (GAUZE/BANDAGES/DRESSINGS) ×2 IMPLANT
SUT ETHILON 2 0 FS 18 (SUTURE) ×3 IMPLANT
SUT MNCRL AB 4-0 PS2 18 (SUTURE) ×3 IMPLANT
SUT VICRYL 0 UR6 27IN ABS (SUTURE) ×6 IMPLANT
TOWEL OR 17X24 6PK STRL BLUE (TOWEL DISPOSABLE) ×3 IMPLANT
TOWEL OR 17X26 10 PK STRL BLUE (TOWEL DISPOSABLE) ×3 IMPLANT
TRAY LAPAROSCOPIC MC (CUSTOM PROCEDURE TRAY) ×3 IMPLANT
TROCAR 12M 150ML BLUNT (TROCAR) ×3 IMPLANT
TROCAR XCEL BLUNT TIP 100MML (ENDOMECHANICALS) ×3 IMPLANT
TROCAR XCEL NON-BLD 5MMX100MML (ENDOMECHANICALS) ×3 IMPLANT
TUBING INSUFFLATION (TUBING) ×3 IMPLANT

## 2016-03-12 NOTE — Interval H&P Note (Signed)
History and Physical Interval Note:  03/12/2016 7:21 AM Discussed lap chole with him for today. I discussed the procedure in detail.    We discussed the risks and benefits of a laparoscopic cholecystectomy and possible cholangiogram including, but not limited to bleeding, infection, injury to surrounding structures such as the intestine or liver, bile leak, retained gallstones, need to convert to an open procedure, prolonged diarrhea, blood clots such as  DVT, common bile duct injury, anesthesia risks, and possible need for additional procedures.  The likelihood of improvement in symptoms and return to the patient's normal status is good. We discussed the typical post-operative recovery course.  Scott Manning  has presented today for surgery, with the diagnosis of cholelethesis  The various methods of treatment have been discussed with the patient and family. After consideration of risks, benefits and other options for treatment, the patient has consented to  Procedure(s): LAPAROSCOPIC CHOLECYSTECTOMY SINGLE SITE WITH INTRAOPERATIVE CHOLANGIOGRAM (N/A) as a surgical intervention .  The patient's history has been reviewed, patient examined, no change in status, stable for surgery.  I have reviewed the patient's chart and labs.  Questions were answered to the patient's satisfaction.     [name]

## 2016-03-12 NOTE — H&P (View-Only) (Signed)
Surgical H&P  CC: Abdominal pain  HPI: This is a 51 year old gentleman with a history of schizoaffective disorder, hepatitis C without known sequelae who presents with acute onset right upper quadrant abdominal pain which began around 3:00 in the morning today. The pain is associated cramping quality and quite severe, it radiates into his epigastrium/chest. He denies nausea/ emesis, denies constipation or diarrhea, denies fevers.   No Known Allergies  PMH: Hepatitis C, denies history of cirrhosis, schizoaffective disorder. Has not been on any medications for over a month.  History reviewed. No pertinent surgical history.  History reviewed. No pertinent family history.  Social History   Social History  . Marital status: Married    Spouse name: N/A  . Number of children: N/A  . Years of education: N/A   Social History Main Topics  . Smoking status: Never Smoker  . Smokeless tobacco: Never Used  . Alcohol use No  . Drug use: No  . Sexual activity: Not Asked   Other Topics Concern  . None   Social History Narrative  . None    No current facility-administered medications on file prior to encounter.    Current Outpatient Prescriptions on File Prior to Encounter  Medication Sig Dispense Refill  . divalproex (DEPAKOTE) 500 MG DR tablet Take 1 tablet (500 mg total) by mouth every 12 (twelve) hours. (Patient not taking: Reported on 03/11/2016) 60 tablet 0  . prazosin (MINIPRESS) 1 MG capsule Take 1 capsule (1 mg total) by mouth at bedtime. (Patient not taking: Reported on 03/11/2016) 30 capsule 0  . risperiDONE (RISPERDAL) 1 MG tablet Take 1 tablet (1 mg total) by mouth at bedtime. (Patient not taking: Reported on 03/11/2016) 30 tablet 0    Review of Systems: a complete, 10pt review of systems was completed with pertinent positives and negatives as documented in the HPI.   Physical Exam: Vitals:   03/11/16 1530 03/11/16 1700  BP: 150/92 152/91  Pulse: 74 70  Resp: 22 20  Temp:      Gen: A&Ox3, Uncomfortable appearing but no acute distress Head: normocephalic, atraumatic, EOMI, anicteric.  Neck: supple without mass or thyromegaly Chest: unlabored respirations, symmetrical air entry Cardiovascular: RRR with palpable distal pulses, no pedal edema Abdomen: Obese, tender in the right upper quadrant and mildly tender in the epigastrium. Voluntary guarding but no signs of peritonitis Extremities: warm, without edema, no deformities Neuro: grossly intact Psych: appropriate mood and affect Skin: No lesions or rashes a limited skin exam   CBC Latest Ref Rng & Units 03/11/2016 09/21/2015  WBC 4.0 - 10.5 K/uL 9.0 15.2(H)  Hemoglobin 13.0 - 17.0 g/dL 17.1(H) 18.5(H)  Hematocrit 39.0 - 52.0 % 48.5 52.9(H)  Platelets 150 - 400 K/uL 179 277    CMP Latest Ref Rng & Units 03/11/2016 09/23/2015 09/21/2015  Glucose 65 - 99 mg/dL 161(W106(H) 90 960(A128(H)  BUN 6 - 20 mg/dL 11 54(U22(H) 12  Creatinine 0.61 - 1.24 mg/dL 9.810.76 1.911.08 4.781.10  Sodium 135 - 145 mmol/L 137 139 136  Potassium 3.5 - 5.1 mmol/L 4.7 4.5 3.2(L)  Chloride 101 - 111 mmol/L 102 105 101  CO2 22 - 32 mmol/L 26 28 24   Calcium 8.9 - 10.3 mg/dL 9.3 9.1 9.4  Total Protein 6.5 - 8.1 g/dL 2.9(F8.7(H) - 9.9(H)  Total Bilirubin 0.3 - 1.2 mg/dL 1.2 - 1.8(H)  Alkaline Phos 38 - 126 U/L 123 - 104  AST 15 - 41 U/L 156(H) - 197(H)  ALT 17 - 63 U/L 148(H) -  175(H)    No results found for: INR, PROTIME  Imaging: CLINICAL DATA:  Right upper quadrant abdominal pain since last night.  EXAM: US ABDOMEN LIMITED - RIGHT UPPER QUADRANT  COMPARISON:  09/21/2015  FINDINGS: Gallbladder:  Gallstone in the gallbladder neck that was not mobile. Stone measures several cm in size. Positive Murphy sign. Gallbladder is distended.  Common bile duct:  Diameter: 5.2 mm, within normal limits.  Liver:  No focal lesion identified. Within normal limits in parenchymal echogenicity.  IMPRESSION: Large gallstone in the gallbladder neck which  did not move. Positive Murphy sign. Findings worrisome for cholecystitis.  A/P: 51 year old gentleman with likely early cholecystitis and a large non-mobile stone. Will admit for pain control and IV antibiotics with likely laparoscopic cholecystectomy in the next 1-2 days.   Phylliss Blakes, MD [facility], [place] Pager [phone]

## 2016-03-12 NOTE — Anesthesia Postprocedure Evaluation (Addendum)
Anesthesia Post Note  Patient: Scott Manning  Procedure(s) Performed: Procedure(s) (LRB): LAPAROSCOPIC CHOLECYSTECTOMY  WITH INTRAOPERATIVE CHOLANGIOGRAM (N/A)  Patient location during evaluation: PACU Anesthesia Type: General Level of consciousness: awake and alert Pain management: pain level controlled Vital Signs Assessment: post-procedure vital signs reviewed and stable Respiratory status: spontaneous breathing, nonlabored ventilation, respiratory function stable and patient connected to nasal cannula oxygen Cardiovascular status: blood pressure returned to baseline and stable Postop Assessment: no signs of nausea or vomiting Anesthetic complications: no       Last Vitals:  Vitals:   03/12/16 1146 03/12/16 1500  BP: (!) 143/93 (!) 175/112  Pulse: 86 92  Resp: 18 16  Temp: 36.8 C 36.8 C    Last Pain:  Vitals:   03/12/16 1512  TempSrc:   PainSc: 10-Worst pain ever                 Keirra Zeimet

## 2016-03-12 NOTE — Anesthesia Preprocedure Evaluation (Signed)
Anesthesia Evaluation  Patient identified by MRN, date of birth, ID band Patient awake    Reviewed: Allergy & Precautions, H&P , NPO status , Patient's Chart, lab work & pertinent test results  Airway Mallampati: II   Neck ROM: full    Dental   Pulmonary neg pulmonary ROS,    breath sounds clear to auscultation       Cardiovascular negative cardio ROS   Rhythm:regular Rate:Normal     Neuro/Psych PSYCHIATRIC DISORDERS Bipolar Disorder Schizophrenia    GI/Hepatic   Endo/Other  obese  Renal/GU      Musculoskeletal   Abdominal   Peds  Hematology   Anesthesia Other Findings   Reproductive/Obstetrics                             Anesthesia Physical Anesthesia Plan  ASA: II  Anesthesia Plan: General   Post-op Pain Management:    Induction: Intravenous  Airway Management Planned: Oral ETT  Additional Equipment:   Intra-op Plan:   Post-operative Plan: Extubation in OR  Informed Consent: I have reviewed the patients History and Physical, chart, labs and discussed the procedure including the risks, benefits and alternatives for the proposed anesthesia with the patient or authorized representative who has indicated his/her understanding and acceptance.     Plan Discussed with: CRNA, Anesthesiologist and Surgeon  Anesthesia Plan Comments:         Anesthesia Quick Evaluation

## 2016-03-12 NOTE — Transfer of Care (Signed)
Immediate Anesthesia Transfer of Care Note  Patient: Scott Manning  Procedure(s) Performed: Procedure(s): LAPAROSCOPIC CHOLECYSTECTOMY  WITH INTRAOPERATIVE CHOLANGIOGRAM (N/A)  Patient Location: PACU  Anesthesia Type:General  Level of Consciousness: oriented and patient cooperative, drowsy  Airway & Oxygen Therapy: Patient Spontanous Breathing and Patient connected to face mask oxygen  Post-op Assessment: Report given to RN and Post -op Vital signs reviewed and stable  Post vital signs: Reviewed and stable  Last Vitals:  Vitals:   03/12/16 0633 03/12/16 0758  BP: (!) 155/96 (!) 152/102  Pulse: 90 85  Resp: 17 20  Temp: 36.8 C 37 C    Last Pain:  Vitals:   03/12/16 0758  TempSrc: Oral  PainSc:       Patients Stated Pain Goal: 2 (03/12/16 0541)  Complications: No apparent anesthesia complications

## 2016-03-12 NOTE — Op Note (Signed)
Preoperative diagnosis:acute cholecystitis Postoperative diagnosis: gangrenous cholecystitis Procedure: laparoscopic cholecystectomy  Surgeon: Dr Harden MoMatt Idonna Heeren Anesthesia: general EBL: 100 cc Drains 19 Fr blake drain to gb fossa Specimen gb and contents to pathology Complications: none Sponge count correct at completion Disposition to recovery stable  Indications: This is a [age] yom with hep c who presents with acute cholecystitis.  Discussed lap chole.   Procedure: After informed consent was obtained the patient was taken to the operating room. He was given antibiotics. Sequential compression devices were on hislegs. Hewas placed under general anesthesia without complication. Hisabdomen was prepped and draped in the standard sterile surgical fashion. A surgical timeout was then performed.  I infiltrated marcaine below the umbilicus. I made a vertical incision and grasped the fascia. I then incised the fascia and entered the peritoneum bluntly.I placed a 0 vicryl pursestring suture and inserted a hasson trocar. I then insufflated the abdomen to 15 mm Hg pressure. I then placed a 5mm epigastric trocar. I placed an additional 2 5 mm trocars in the right abdomen. There were some adhesions from duodenum and the omentum that were taken down bluntly. 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 I was able to retract the gallbladder cephalad and laterally. He had acute cholecystitis with stone impacted at cystic duct making it difficult to manipulate. Eventually I was able to identify the cystic duct and the cystic artery. I then obtained the critical view of safety. I clipped and divided the cystic artery. There were anterior and posterior branches clipped separately and these were right off the right hepatic artery. The cystic duct was viable.I treated the cystic duct in a similar fashion with clips. The clips traversed the duct. I then removed the gallbladder from the liver bed and  placed it in a bag. I did enter into the gallbladder where it was fused with the liver and there was clear fluid present. I placed a blake drain in the gb fossa via the right sided port and secured this with 2-0 nylon. I then obtained hemostasis and irrigated. I did place surgicel snoI removed the hasson trocar and tied the pursestring down. I placed twoadditional two0 vicryl stitchesat the umbilical incision with the endoclose device. I then desufflated the abdomen and removed all my remaining trocars. I then closed these with 4-0 Monocryl and Dermabond. He tolerated this well was extubated and transferred to the recovery room in stable condition

## 2016-03-12 NOTE — Anesthesia Procedure Notes (Signed)
Procedure Name: Intubation Date/Time: 03/12/2016 9:34 AM Performed by: Merdis Delay Pre-anesthesia Checklist: Patient identified, Emergency Drugs available, Suction available, Timeout performed and Patient being monitored Patient Re-evaluated:Patient Re-evaluated prior to inductionOxygen Delivery Method: Circle system utilized Preoxygenation: Pre-oxygenation with 100% oxygen Intubation Type: IV induction Ventilation: Mask ventilation without difficulty and Two handed mask ventilation required Laryngoscope Size: Mac and 4 Grade View: Grade I Tube type: Oral Tube size: 7.5 mm Number of attempts: 1 Airway Equipment and Method: Stylet Placement Confirmation: ETT inserted through vocal cords under direct vision,  breath sounds checked- equal and bilateral and positive ETCO2 Secured at: 22 cm Tube secured with: Tape Dental Injury: Teeth and Oropharynx as per pre-operative assessment  Comments: 2 handed mask required 2/2 facial hair creating poor seal

## 2016-03-13 ENCOUNTER — Encounter (HOSPITAL_COMMUNITY): Payer: Self-pay | Admitting: General Surgery

## 2016-03-13 NOTE — Progress Notes (Signed)
1 Day Post-Op  Subjective: POD #1.  Laparoscopic cholecystectomy for gangrenous, necrotic gallbladder.  Stable.  Alert.  Sitting up in chair.  Has ambulated in halls. Having pain but much better than preop. No nausea or vomiting. Waiting on breakfast. No labs today. On Rocephin   Objective: Vital signs in last 24 hours: Temp:  [98.1 F (36.7 C)-98.6 F (37 C)] 98.6 F (37 C) (03/04 0400) Pulse Rate:  [83-98] 98 (03/04 0400) Resp:  [14-19] 19 (03/04 0400) BP: (130-175)/(90-112) 142/94 (03/04 0756) SpO2:  [92 %-98 %] 96 % (03/04 0400) Last BM Date: 03/11/16  Intake/Output from previous day: 03/03 0701 - 03/04 0700 In: 3776.7 [P.O.:750; I.V.:3026.7] Out: 1645 [Urine:1300; Drains:310; Blood:35] Intake/Output this shift: Total I/O In: 381.7 [I.V.:381.7] Out: 235 [Urine:200; Drains:35]   EXAM: General appearance: Alert.  Cooperative.  Mild distress.  Mental status normal. Resp: clear to auscultation bilaterally GI: Large abdomen.  Soft.  Appropriately tender.  Not distended.  JP drainage serosanguineous.  No bile or enteric contents.  Lab Results:  No results found for this or any previous visit (from the past 24 hour(s)).   Studies/Results: No results found.  . cefTRIAXone (ROCEPHIN)  IV  2 g Intravenous Q24H  . divalproex  500 mg Oral Q12H  . docusate sodium  100 mg Oral BID  . enoxaparin (LOVENOX) injection  40 mg Subcutaneous Q24H  . prazosin  1 mg Oral QHS  . risperiDONE  1 mg Oral QHS     Assessment/Plan: s/p Procedure(s): LAPAROSCOPIC CHOLECYSTECTOMY  WITH INTRAOPERATIVE CHOLANGIOGRAM   POD #1.  Laparoscopic cholecystectomy for gangrenous, necrotic cholecystitis  Continue Rocephin  Advance diet and activities  Check labs tomorrow   @PROBHOSP @  LOS: 0 days    Scott Manning M 03/13/2016  . .prob

## 2016-03-14 ENCOUNTER — Encounter (HOSPITAL_COMMUNITY): Payer: Self-pay | Admitting: General Practice

## 2016-03-14 LAB — COMPREHENSIVE METABOLIC PANEL
ALT: 83 U/L — ABNORMAL HIGH (ref 17–63)
ANION GAP: 5 (ref 5–15)
AST: 73 U/L — ABNORMAL HIGH (ref 15–41)
Albumin: 2.8 g/dL — ABNORMAL LOW (ref 3.5–5.0)
Alkaline Phosphatase: 74 U/L (ref 38–126)
BUN: 17 mg/dL (ref 6–20)
CALCIUM: 8.4 mg/dL — AB (ref 8.9–10.3)
CHLORIDE: 104 mmol/L (ref 101–111)
CO2: 26 mmol/L (ref 22–32)
Creatinine, Ser: 0.8 mg/dL (ref 0.61–1.24)
GFR calc non Af Amer: >60 mL/min (ref >60)
Glucose, Bld: 95 mg/dL (ref 65–99)
POTASSIUM: 3.8 mmol/L (ref 3.5–5.1)
SODIUM: 135 mmol/L (ref 135–145)
Total Bilirubin: 0.9 mg/dL (ref 0.3–1.2)
Total Protein: 7 g/dL (ref 6.5–8.1)

## 2016-03-14 LAB — CBC
HCT: 39 % (ref 39.0–52.0)
Hemoglobin: 13.2 g/dL (ref 13.0–17.0)
MCH: 31.8 pg (ref 26.0–34.0)
MCHC: 33.8 g/dL (ref 30.0–36.0)
MCV: 94 fL (ref 78.0–100.0)
PLATELETS: 153 10*3/uL (ref 150–400)
RBC: 4.15 MIL/uL — ABNORMAL LOW (ref 4.22–5.81)
RDW: 13.9 % (ref 11.5–15.5)
WBC: 12 10*3/uL — ABNORMAL HIGH (ref 4.0–10.5)

## 2016-03-14 MED ORDER — AMOXICILLIN-POT CLAVULANATE 875-125 MG PO TABS
1.0000 | ORAL_TABLET | Freq: Two times a day (BID) | ORAL | Status: DC
  Administered 2016-03-14 – 2016-03-15 (×3): 1 via ORAL
  Filled 2016-03-14 (×3): qty 1

## 2016-03-14 MED ORDER — HYDROMORPHONE HCL 2 MG/ML IJ SOLN: 1.0000 mg | INTRAMUSCULAR | Status: DC | PRN

## 2016-03-14 NOTE — Progress Notes (Signed)
2 Days Post-Op  [facility] Progress Note  Subjective: Patient states he feels "so much better today." States abdominal bloating has resolved and he is passing flatus. No BM yet. Pain is minimal. Has been ambulatory in the halls. No N/V.   Objective: Vital signs in last 24 hours: Temp:  [98 F (36.7 C)-99.3 F (37.4 C)] 98 F (36.7 C) (03/05 0528) Pulse Rate:  [72-95] 72 (03/05 0528) Resp:  [18] 18 (03/05 0528) BP: (121-163)/(86-99) 121/86 (03/05 0528) SpO2:  [95 %-97 %] 96 % (03/05 0528) Last BM Date: 03/12/15  Intake/Output from previous day: 03/04 0701 - 03/05 0700 In: 1596.7 [P.O.:400; I.V.:1196.7] Out: 475 [Urine:375; Drains:100] Intake/Output this shift: Total I/O In: 120 [P.O.:120] Out: 80 [Drains:80]  PE: General: pleasant, obese Hispanic male who is laying in bed in NAD. HEENT: head is normocephalic, atraumatic.  Sclera are non-injected and non-icteric. Mouth is pink and moist Heart: regular, rate, and rhythm.  Normal s1,s2. No obvious murmurs. Lungs: CTAB, no wheezes, rhonchi, or rales noted.  Respiratory effort nonlabored Abd: soft, obese, minimal incisional tenderness, ND, +BS. Surgical incisions are clean, dry and intact with wound adhesive. Blake drain in place draining serous fluid.  Skin: warm and dry Psych: A&Ox3 with an appropriate affect.  Lab Results:   Recent Labs  03/12/16 0534 03/14/16 0406  WBC 11.8* 12.0*  HGB 15.5 13.2  HCT 45.8 39.0  PLT 159 153   BMET  Recent Labs  03/12/16 0534 03/14/16 0406  NA 139 135  K 4.8 3.8  CL 105 104  CO2 25 26  GLUCOSE 120* 95  BUN 10 17  CREATININE 0.72 0.80  CALCIUM 8.9 8.4*   PT/INR  Recent Labs  03/12/16 0534  LABPROT 14.4  INR 1.11   CMP     Component Value Date/Time   NA 135 03/14/2016 0406   K 3.8 03/14/2016 0406   CL 104 03/14/2016 0406   CO2 26 03/14/2016 0406   GLUCOSE 95 03/14/2016 0406   BUN 17 03/14/2016 0406   CREATININE 0.80 03/14/2016 0406   CALCIUM 8.4  (L) 03/14/2016 0406   PROT 7.0 03/14/2016 0406   ALBUMIN 2.8 (L) 03/14/2016 0406   AST 73 (H) 03/14/2016 0406   ALT 83 (H) 03/14/2016 0406   ALKPHOS 74 03/14/2016 0406   BILITOT 0.9 03/14/2016 0406   GFRNONAA >60 03/14/2016 0406   GFRAA >60 03/14/2016 0406   Lipase     Component Value Date/Time   LIPASE 21 03/11/2016 1245       Studies/Results: No results found.  Anti-infectives: Anti-infectives    Start     Dose/Rate Route Frequency Ordered Stop   03/12/16 1830  cefTRIAXone (ROCEPHIN) 2 g in dextrose 5 % 50 mL IVPB     2 g 100 mL/hr over 30 Minutes Intravenous Every 24 hours 03/11/16 1925     03/11/16 1715  cefTRIAXone (ROCEPHIN) 2 g in dextrose 5 % 50 mL IVPB     2 g 100 mL/hr over 30 Minutes Intravenous  Once 03/11/16 1704 03/11/16 1911       Assessment/Plan  POD #2 s/p LAPAROSCOPIC CHOLECYSTECTOMY  WITH INTRAOPERATIVE CHOLANGIOGRAM for gangrenous necrotic cholecystitis. (Dr. Dwain Sarna) Surg. Path pend. FEN: Will advance to soft diet as patient reports return of bowel function. VTE Proph.: Lovenox and SCDs. Continue ambulation.  ID: Still with mild leukocytosis. Afebrile. On Rocephin. Will switch to oral ABX when leukocytosis resolves. GI: BMET normal and LFTs improving. TBili normal.  Plan: Anticipate discharge to  home in next 24 hours.    LOS: 1 day    LEE Antoinetta Berrones, [facility] BurdickA-C [facility] 03/14/2016, 10:48 AM

## 2016-03-15 LAB — CBC
HEMATOCRIT: 38.2 % — AB (ref 39.0–52.0)
HEMOGLOBIN: 13 g/dL (ref 13.0–17.0)
MCH: 31.8 pg (ref 26.0–34.0)
MCHC: 34 g/dL (ref 30.0–36.0)
MCV: 93.4 fL (ref 78.0–100.0)
PLATELETS: 147 10*3/uL — AB (ref 150–400)
RBC: 4.09 MIL/uL — AB (ref 4.22–5.81)
RDW: 13.3 % (ref 11.5–15.5)
WBC: 9.2 10*3/uL (ref 4.0–10.5)

## 2016-03-15 LAB — COMPREHENSIVE METABOLIC PANEL
ALK PHOS: 103 U/L (ref 38–126)
ALT: 77 U/L — AB (ref 17–63)
AST: 70 U/L — AB (ref 15–41)
Albumin: 2.8 g/dL — ABNORMAL LOW (ref 3.5–5.0)
Anion gap: 6 (ref 5–15)
BUN: 11 mg/dL (ref 6–20)
CO2: 28 mmol/L (ref 22–32)
CREATININE: 0.85 mg/dL (ref 0.61–1.24)
Calcium: 8.4 mg/dL — ABNORMAL LOW (ref 8.9–10.3)
Chloride: 104 mmol/L (ref 101–111)
GFR calc Af Amer: >60 mL/min (ref >60)
Glucose, Bld: 104 mg/dL — ABNORMAL HIGH (ref 65–99)
Potassium: 3.9 mmol/L (ref 3.5–5.1)
Sodium: 138 mmol/L (ref 135–145)
Total Bilirubin: 0.7 mg/dL (ref 0.3–1.2)
Total Protein: 6.8 g/dL (ref 6.5–8.1)

## 2016-03-15 MED ORDER — OXYCODONE HCL 5 MG PO TABS: 5.0000 mg | ORAL_TABLET | ORAL | 0 refills | Status: AC | PRN

## 2016-03-15 MED ORDER — AMOXICILLIN-POT CLAVULANATE 875-125 MG PO TABS: 1.0000 | ORAL_TABLET | Freq: Two times a day (BID) | ORAL | 0 refills | Status: AC

## 2016-03-15 NOTE — Discharge Summary (Signed)
[facility] Discharge Summary   Patient ID: Scott Manning MRN: [id number] DOB/AGE: 52/02/1965 52 y.o.  Admit date: 03/11/2016 Discharge date: 03/15/2016  Admitting Diagnosis: Cholecystitis   Discharge Diagnosis Patient Active Problem List   Diagnosis Date Noted  . Cholecystitis 03/11/2016  . Schizoaffective disorder, bipolar type (HCC) 09/23/2015    Consultants None  Imaging: No results found.  Procedures Dr. Dwain SarnaWakefield (03/12/16) - Laparoscopic Cholecystectomy with placement of drain   Hospital Course:  Scott Manning is a 51 year old male with a history of schizoaffective disorder, Hep C who [facility] with RUQ abdominal pain.  Workup showed cholecystitis.  Patient was admitted and underwent procedure listed above.  Tolerated procedure well and was transferred to the floor.  Diet was advanced as tolerated.  On POD#3, the patient was voiding well, tolerating diet, ambulating well, pain well controlled, vital signs stable, incisions c/d/i and felt stable for discharge home. Drain was removed prior to discharge. Patient will follow up in our office in 2 weeks and knows to call with questions or concerns.  He will call to confirm appointment date/time.    Patient was discharged in good condition.  The [place] Substance controlled database was reviewed prior to prescribing narcotic pain medication to this patient.  Physical Exam: General:  Alert, NAD, pleasant, cooperative, well appearing Cardio: RRR, S1 & S2 normal, no murmur, rubs, gallops Resp: Effort normal, lungs CTA bilaterally, no wheezes, rales, rhonchi Abd:  Soft, ND, incisions C/D/I, drain with serosanguinous drainage no signs of bilious drainage, mild TTP worse around drain site   Allergies as of 03/15/2016   No Known Allergies     Medication List    TAKE these medications   amoxicillin-clavulanate 875-125 MG tablet Commonly known as:  AUGMENTIN Take 1 tablet by mouth every 12  (twelve) hours.   divalproex 500 MG DR tablet Commonly known as:  DEPAKOTE Take 1 tablet (500 mg total) by mouth every 12 (twelve) hours.   oxyCODONE 5 MG immediate release tablet Commonly known as:  Oxy IR/ROXICODONE Take 1 tablet (5 mg total) by mouth every 4 (four) hours as needed for moderate pain.   prazosin 1 MG capsule Commonly known as:  MINIPRESS Take 1 capsule (1 mg total) by mouth at bedtime.   risperiDONE 1 MG tablet Commonly known as:  RISPERDAL Take 1 tablet (1 mg total) by mouth at bedtime.         Signed: Kathyrn DrownJessica L Decarlo Rivet [facility] 03/15/2016, 7:58 AM Pager: [phone] Consults: [phone] Mon-Fri 7:00 am-4:30 pm Sat-Sun 7:00 am-11:30 am

## 2016-03-15 NOTE — Discharge Instructions (Signed)

## 2016-03-15 NOTE — Progress Notes (Signed)
Pt discharged to home.  Discharge instructions explained to pt. Pt has no questions at the time of discharge.  Pt states he has all belongings.  IV removed.  JP drain to RLQ removed.  Pt ambulated off unit on his own.

## 2016-03-15 NOTE — Care Management Note (Signed)
Case Management Note  Patient Details  Name: Scott Manning MRN: [id number] Date of BirFransico Settersth: 04/05/1965  Subjective/Objective:                    Action/Plan:  MATCH letter and [facility] information given to patient. Patient voiced understanding  Expected Discharge Date:  03/15/16               Expected Discharge Plan:  Home/Self Care  In-House Referral:     Discharge planning Services  CM Consult, MATCH Program, Medication Assistance, Indigent Health Clinic  Post Acute Care Choice:    Choice offered to:  Patient  DME Arranged:    DME Agency:     HH Arranged:    HH Agency:     Status of Service:  Completed, signed off  If discussed at [employer] Meetings, dates discussed:    Additional Comments:  Kingsley PlanWile, [name], RN 03/15/2016, 10:33 AM

## 2016-06-13 NOTE — Addendum Note (Signed)
Addendum  created 06/13/16 1203 by Jahmar Mckelvy, MD   Sign clinical note    

## 2017-12-07 IMAGING — US US ABDOMEN LIMITED
1 series · 14 of 25 positions shown · non-contrast
Comparison: 09/21/2015

CLINICAL DATA: Right upper quadrant abdominal pain since last
night.

EXAM:
US ABDOMEN LIMITED - RIGHT UPPER QUADRANT

[Series 1: us abdomen limited · 0.31mm/px · 14 of 51 slices shown]
[im 1/51]
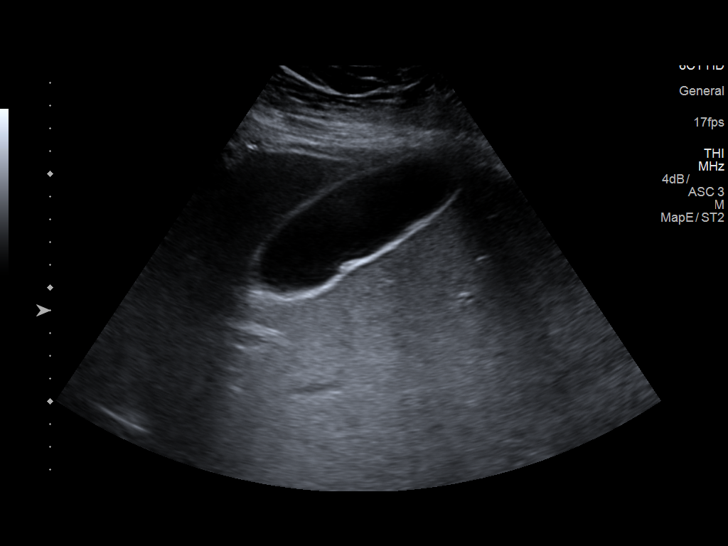
[im 5/51]
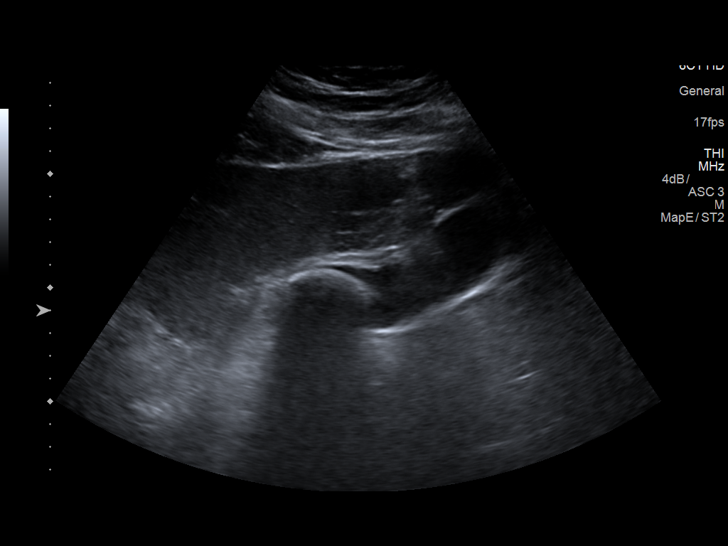
[im 9/51]
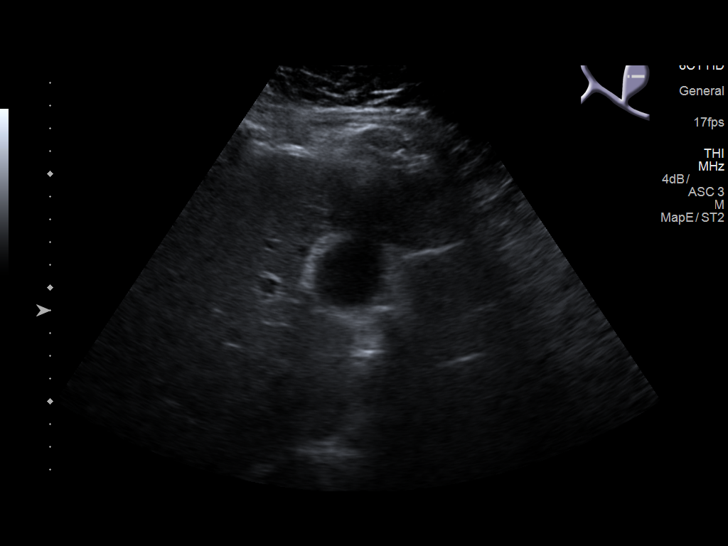
[im 13/51]
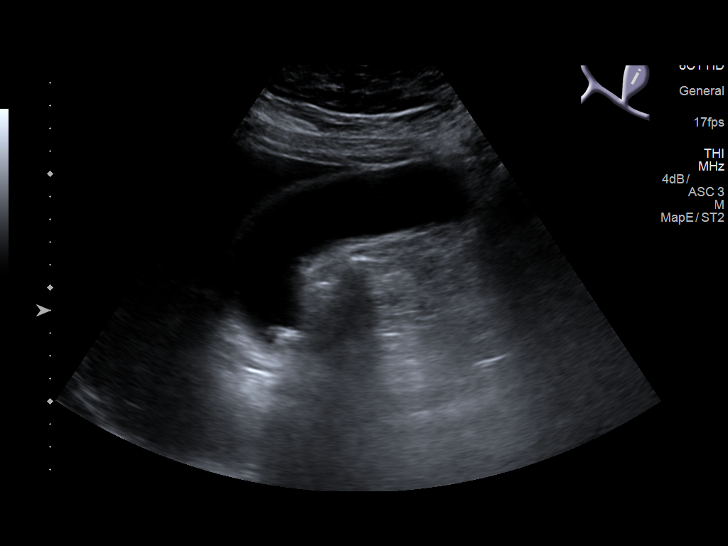
[im 17/51]
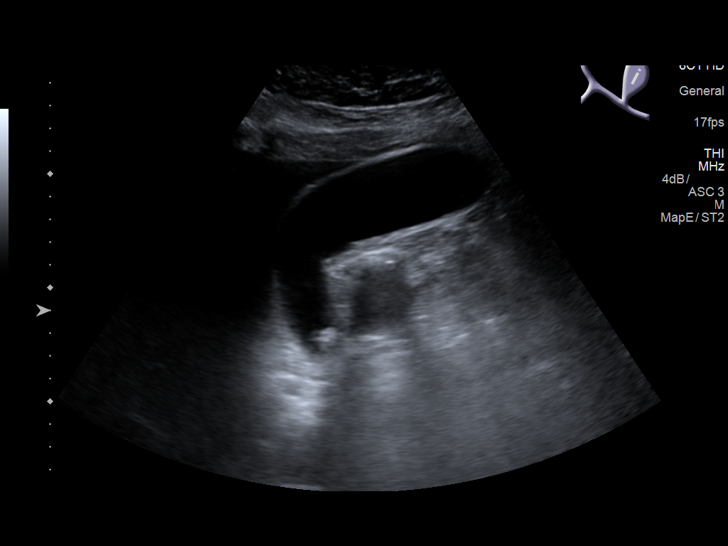
[im 19/51]
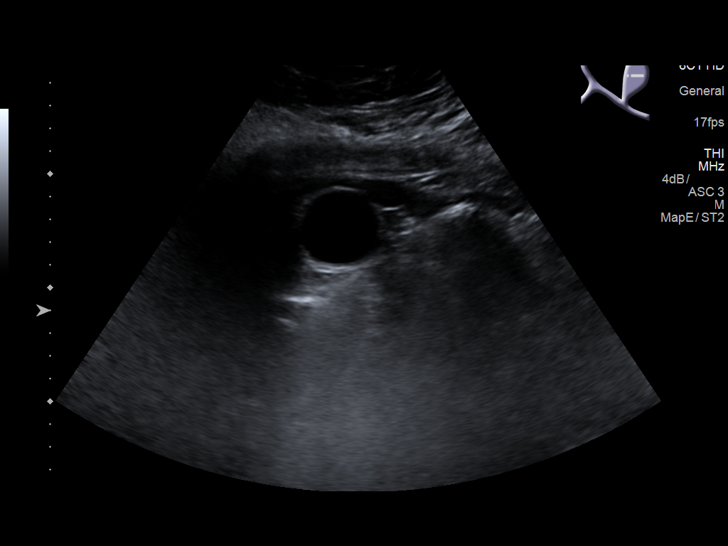
[im 23/51]
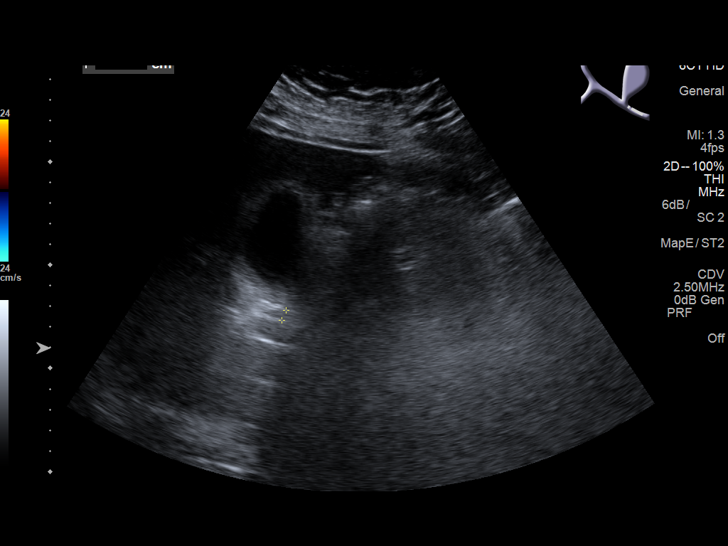
[im 28/51]
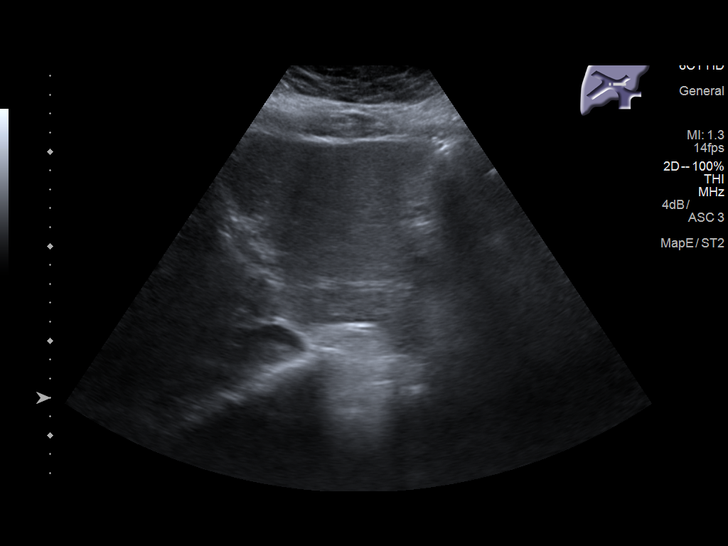
[im 32/51]
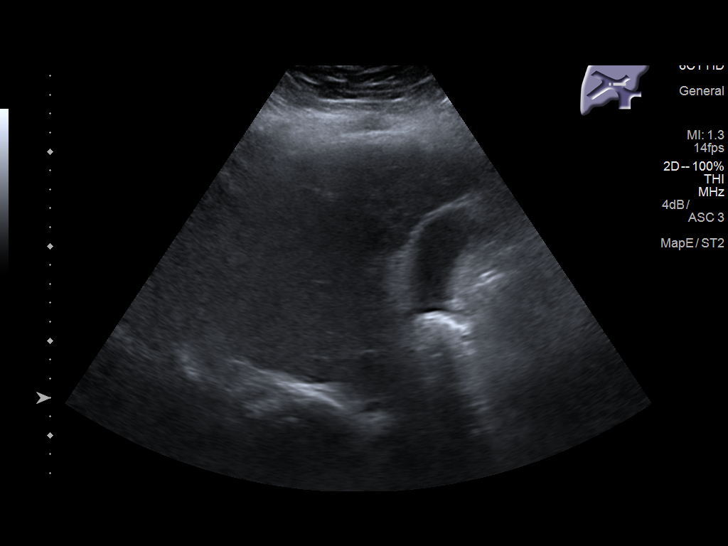
[im 34/51]
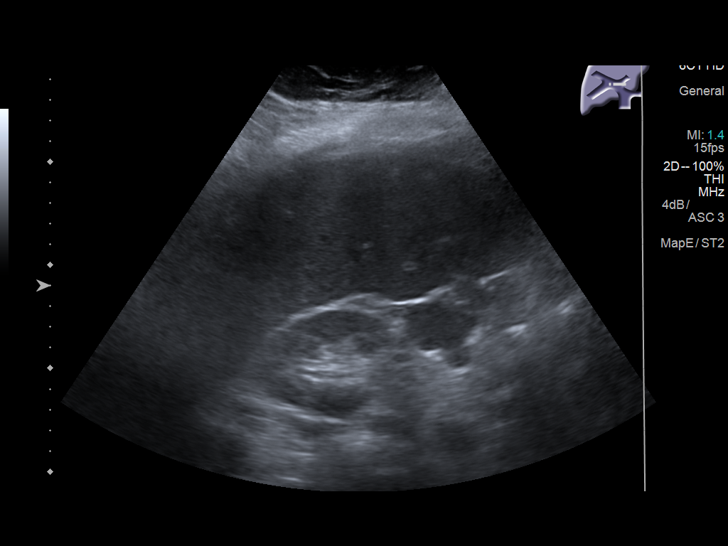
[im 38/51]
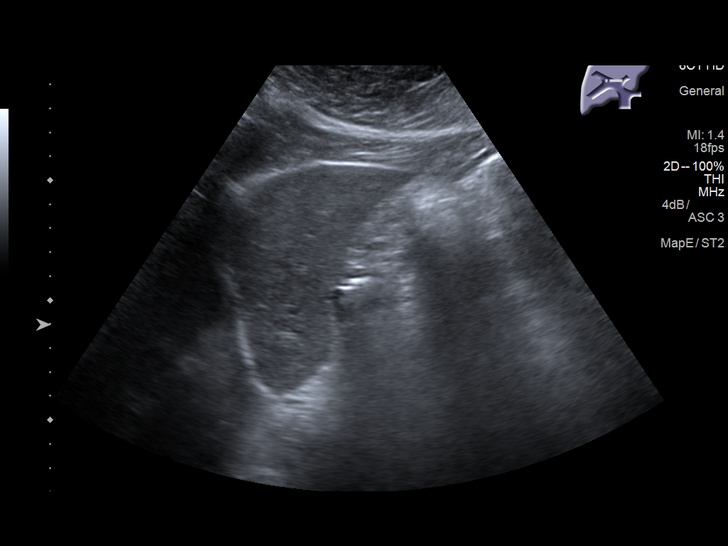
[im 42/51]
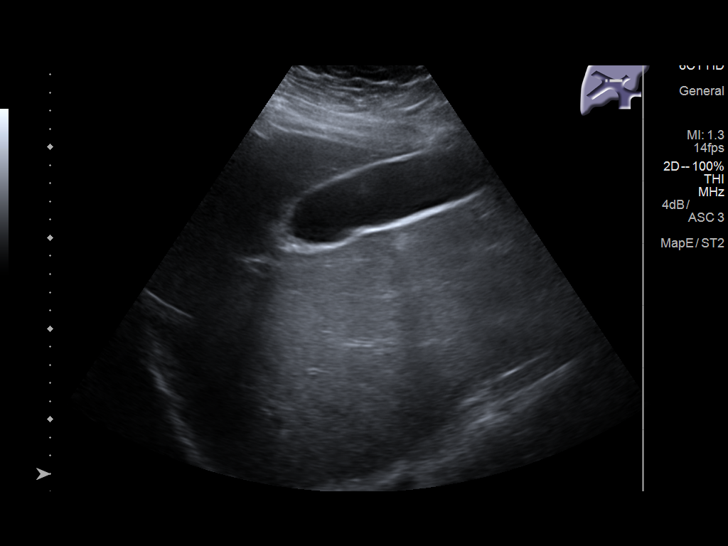
[im 46/51]
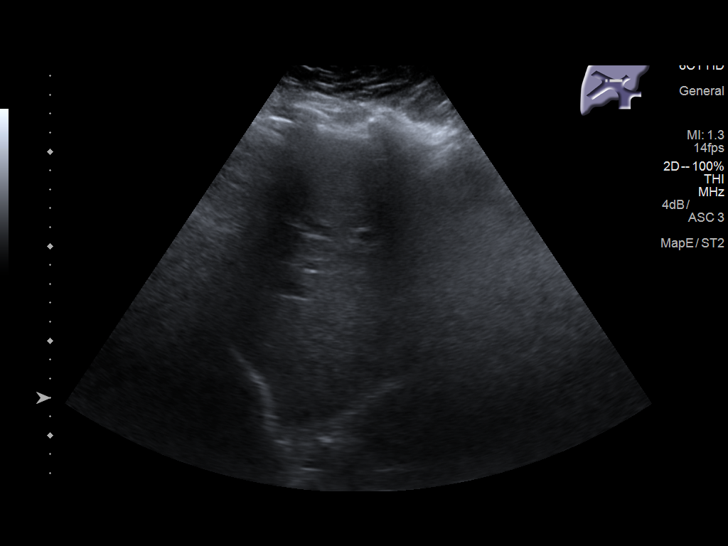
[im 51/51]
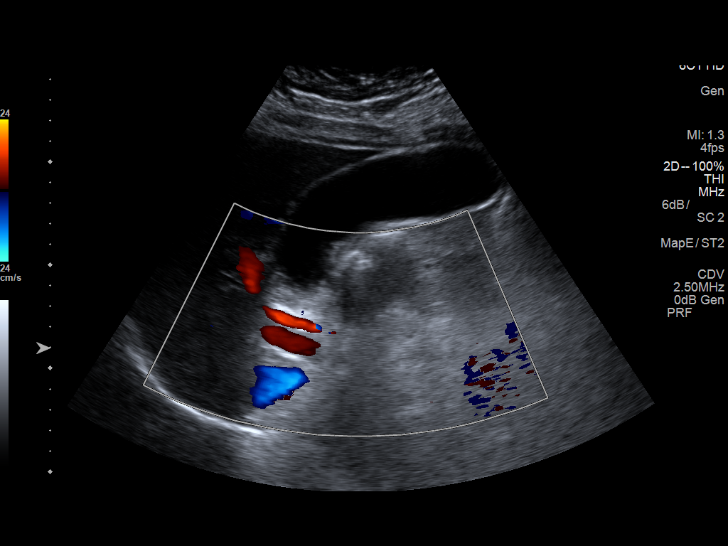

[14 of 25 positions shown; findings below may reference images not displayed]

FINDINGS: Gallbladder:

Gallstone in the gallbladder neck that was not mobile. Stone
measures several cm in size. Positive Murphy sign. Gallbladder is
distended.

Common bile duct:

Diameter: 5.2 mm, within normal limits.

Liver:

No focal lesion identified. Within normal limits in parenchymal
echogenicity.
IMPRESSION: Large gallstone in the gallbladder neck which did not move. Positive
Murphy sign. Findings worrisome for cholecystitis.

## 2018-06-23 IMAGING — CR DG CHEST 2V
2 series · 2 of 2 positions shown · non-contrast
Comparison: No recent prior.

CLINICAL DATA: Chest pain.

EXAM:
CHEST  2 VIEW

[chest pa]
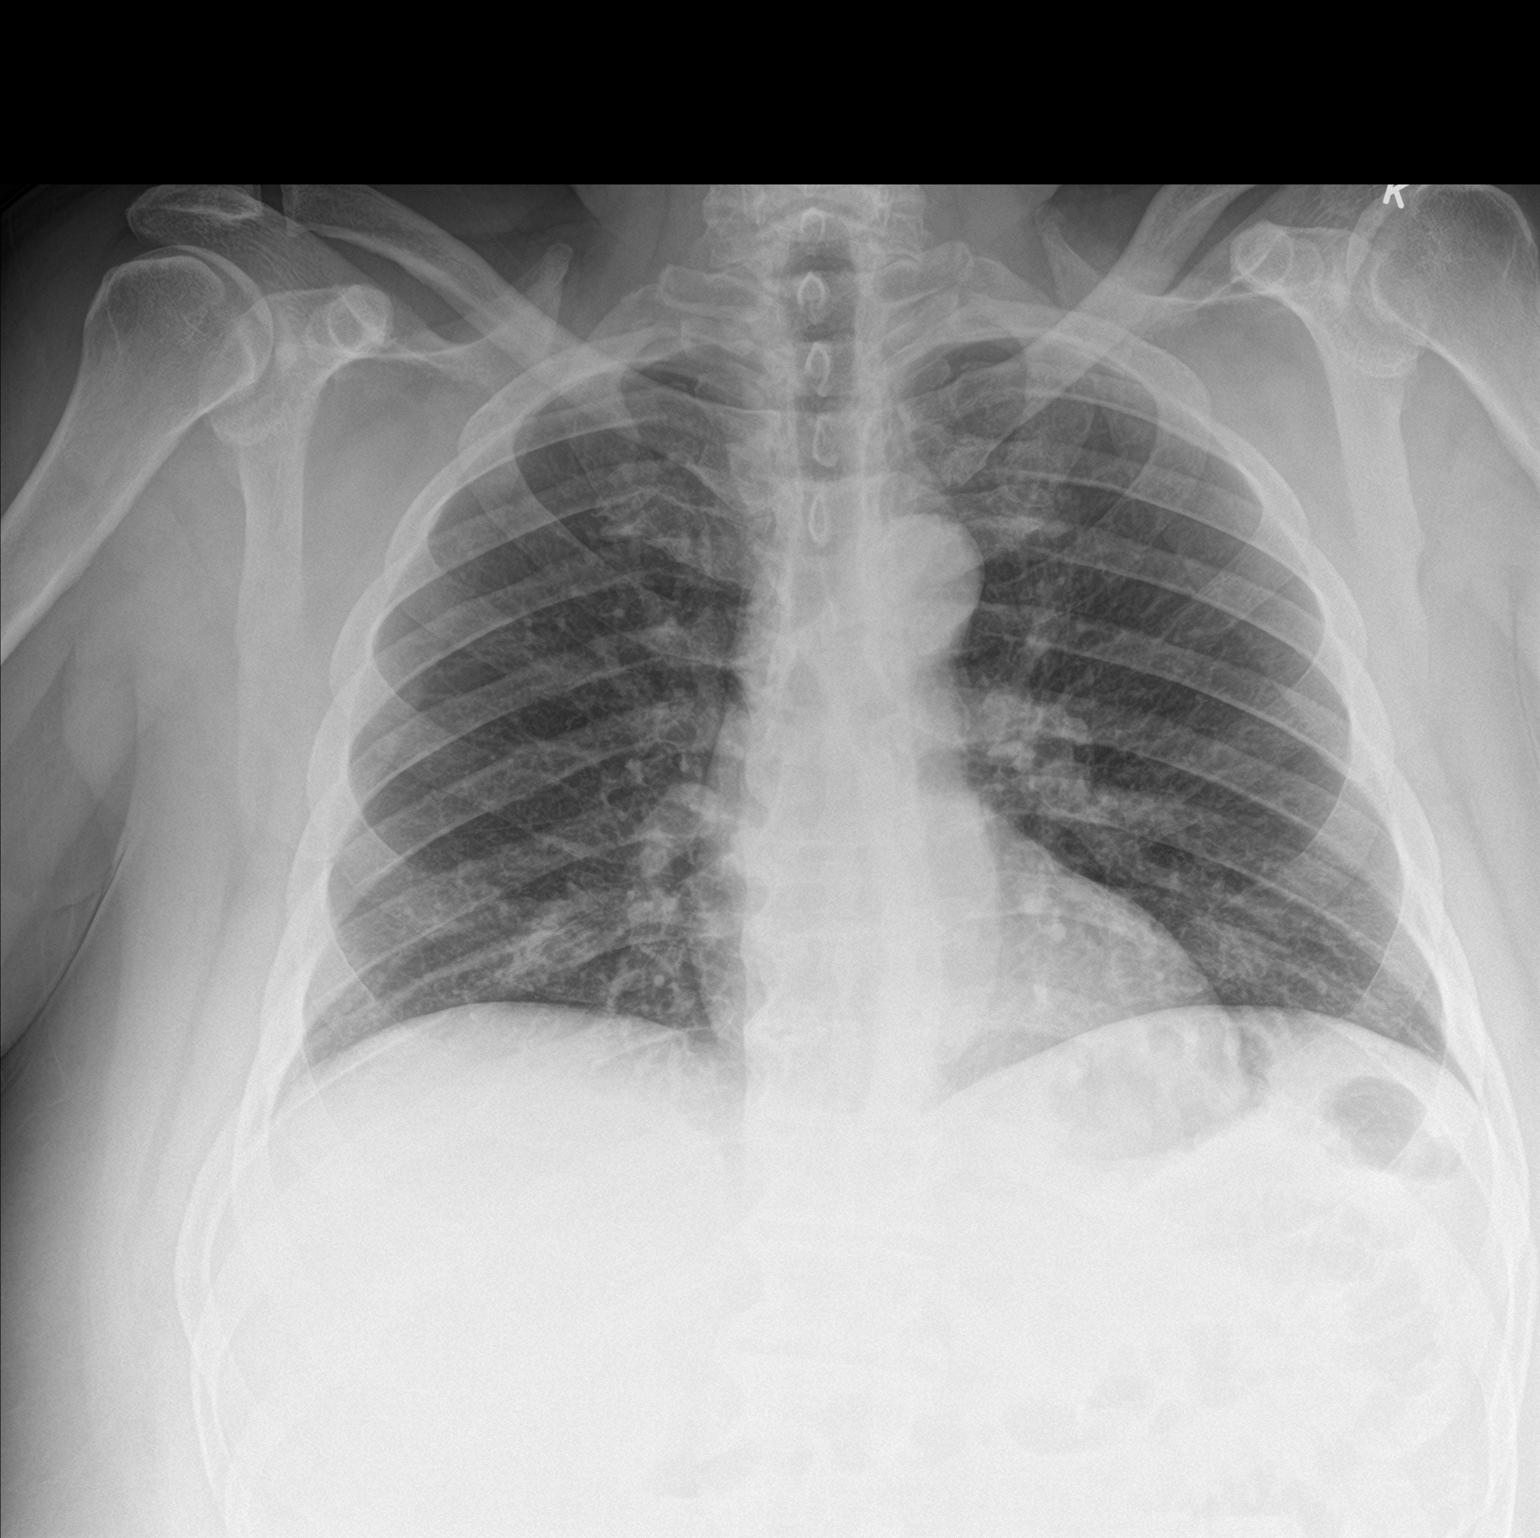

[chest lat]
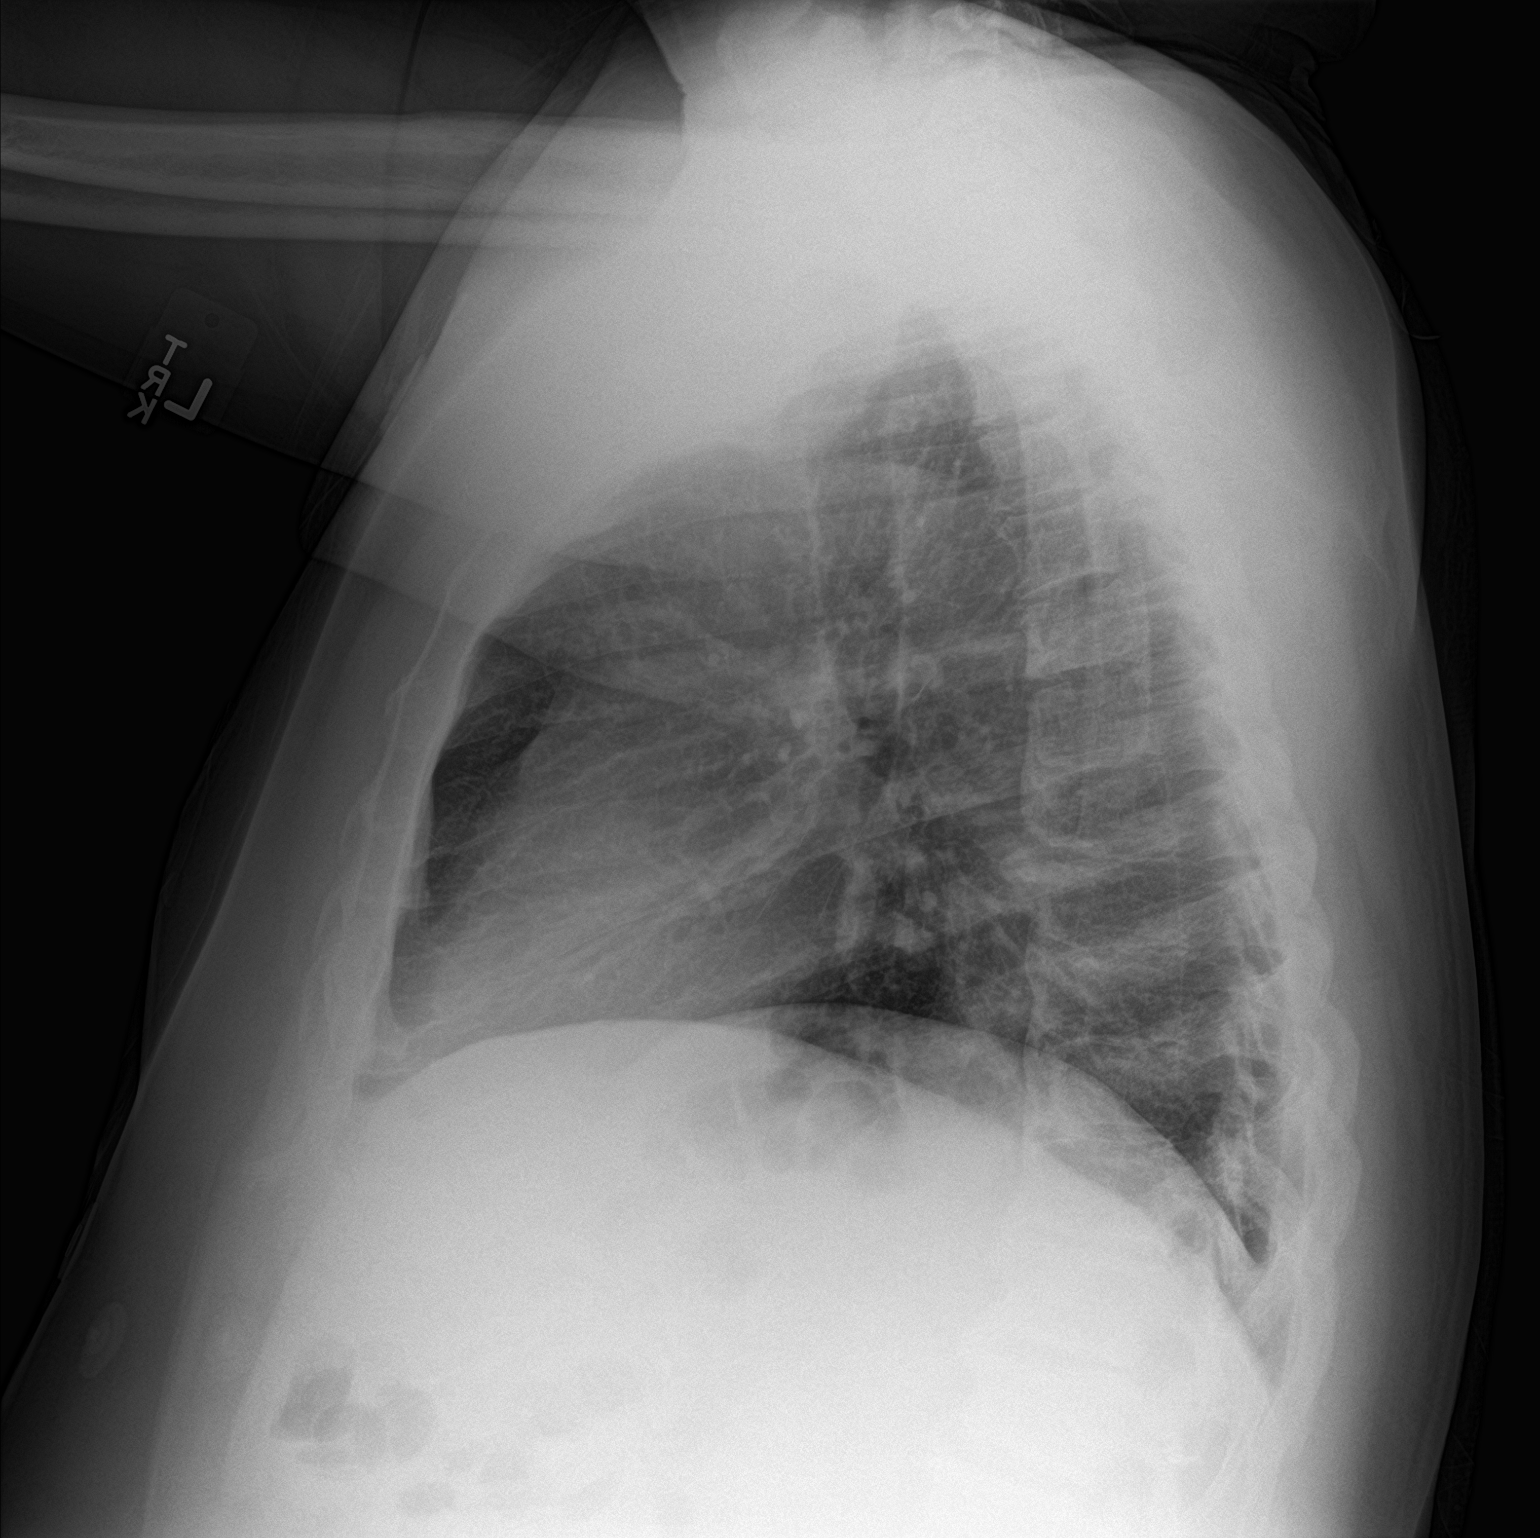

[2 of 2 positions shown; findings below may reference images not displayed]

FINDINGS: Mediastinum and hilar structures normal. Low lung volumes with mild
basilar atelectasis. No pleural effusion or pneumothorax. No acute
bony abnormality .
IMPRESSION: No acute cardiopulmonary disease. Low lung volumes with mild basilar
atelectasis .

## 2018-08-22 ENCOUNTER — Observation Stay (HOSPITAL_COMMUNITY)
Admission: AD | Admit: 2018-08-22 | Discharge: 2018-08-22 | Discharge status: Against Medical Advice | Payer: Federal, State, Local not specified - Other | Attending: Psychiatry | Admitting: Psychiatry

## 2018-08-22 ENCOUNTER — Encounter (HOSPITAL_COMMUNITY): Payer: Self-pay | Admitting: Behavioral Health

## 2018-08-22 ENCOUNTER — Other Ambulatory Visit: Payer: Self-pay

## 2018-08-22 DIAGNOSIS — F515 Nightmare disorder: Secondary | ICD-10-CM | POA: Insufficient documentation

## 2018-08-22 DIAGNOSIS — G4700 Insomnia, unspecified: Secondary | ICD-10-CM | POA: Insufficient documentation

## 2018-08-22 DIAGNOSIS — Z79899 Other long term (current) drug therapy: Secondary | ICD-10-CM | POA: Insufficient documentation

## 2018-08-22 DIAGNOSIS — F250 Schizoaffective disorder, bipolar type: Principal | ICD-10-CM

## 2018-08-22 DIAGNOSIS — F4310 Post-traumatic stress disorder, unspecified: Secondary | ICD-10-CM | POA: Insufficient documentation

## 2018-08-22 DIAGNOSIS — Z20828 Contact with and (suspected) exposure to other viral communicable diseases: Secondary | ICD-10-CM | POA: Insufficient documentation

## 2018-08-22 DIAGNOSIS — I10 Essential (primary) hypertension: Secondary | ICD-10-CM | POA: Insufficient documentation

## 2018-08-22 LAB — COMPREHENSIVE METABOLIC PANEL
ALT: 139 U/L — ABNORMAL HIGH (ref 0–44)
AST: 165 U/L — ABNORMAL HIGH (ref 15–41)
Albumin: 3.7 g/dL (ref 3.5–5.0)
Alkaline Phosphatase: 104 U/L (ref 38–126)
Anion gap: 12 (ref 5–15)
BUN: 14 mg/dL (ref 6–20)
CO2: 23 mmol/L (ref 22–32)
Calcium: 9.3 mg/dL (ref 8.9–10.3)
Chloride: 102 mmol/L (ref 98–111)
Creatinine, Ser: 0.76 mg/dL (ref 0.61–1.24)
GFR calc Af Amer: >60 mL/min (ref >60)
GFR calc non Af Amer: >60 mL/min (ref >60)
Glucose, Bld: 129 mg/dL — ABNORMAL HIGH (ref 70–99)
Potassium: 3.3 mmol/L — ABNORMAL LOW (ref 3.5–5.1)
Sodium: 137 mmol/L (ref 135–145)
Total Bilirubin: 1.9 mg/dL — ABNORMAL HIGH (ref 0.3–1.2)
Total Protein: 8.3 g/dL — ABNORMAL HIGH (ref 6.5–8.1)

## 2018-08-22 LAB — URINALYSIS, COMPLETE (UACMP) WITH MICROSCOPIC
Bilirubin Urine: NEGATIVE
Glucose, UA: NEGATIVE mg/dL
Hgb urine dipstick: NEGATIVE
Ketones, ur: NEGATIVE mg/dL
Leukocytes,Ua: NEGATIVE
Nitrite: NEGATIVE
Protein, ur: 30 mg/dL — AB
Specific Gravity, Urine: 1.021 (ref 1.005–1.030)
pH: 5 (ref 5.0–8.0)

## 2018-08-22 LAB — CBC
HCT: 48.4 % (ref 39.0–52.0)
Hemoglobin: 16.2 g/dL (ref 13.0–17.0)
MCH: 31.3 pg (ref 26.0–34.0)
MCHC: 33.5 g/dL (ref 30.0–36.0)
MCV: 93.6 fL (ref 80.0–100.0)
Platelets: 181 10*3/uL (ref 150–400)
RBC: 5.17 MIL/uL (ref 4.22–5.81)
RDW: 13.2 % (ref 11.5–15.5)
WBC: 10.2 10*3/uL (ref 4.0–10.5)
nRBC: 0 % (ref 0.0–0.2)

## 2018-08-22 LAB — RAPID URINE DRUG SCREEN, HOSP PERFORMED
Amphetamines: POSITIVE — AB
Barbiturates: NOT DETECTED
Benzodiazepines: NOT DETECTED
Cocaine: NOT DETECTED
Opiates: NOT DETECTED
Tetrahydrocannabinol: POSITIVE — AB

## 2018-08-22 LAB — ETHANOL: Alcohol, Ethyl (B): <10 mg/dL (ref <10)

## 2018-08-22 LAB — TSH: TSH: 1.069 u[IU]/mL (ref 0.350–4.500)

## 2018-08-22 LAB — LIPID PANEL
Cholesterol: 166 mg/dL (ref 0–200)
HDL: 37 mg/dL — ABNORMAL LOW (ref >40)
LDL Cholesterol: 116 mg/dL — ABNORMAL HIGH (ref 0–99)
Total CHOL/HDL Ratio: 4.5 RATIO
Triglycerides: 65 mg/dL (ref <150)
VLDL: 13 mg/dL (ref 0–40)

## 2018-08-22 LAB — HEMOGLOBIN A1C
Hgb A1c MFr Bld: 4.9 % (ref 4.8–5.6)
Mean Plasma Glucose: 93.93 mg/dL

## 2018-08-22 LAB — SARS CORONAVIRUS 2 BY RT PCR (HOSPITAL ORDER, PERFORMED IN ~~LOC~~ HOSPITAL LAB): SARS Coronavirus 2: NEGATIVE

## 2018-08-22 MED ORDER — RISPERIDONE 1 MG PO TABS
1.0000 mg | ORAL_TABLET | Freq: Every day | ORAL | Status: DC
  Administered 2018-08-22: 1 mg via ORAL
  Filled 2018-08-22: qty 1

## 2018-08-22 MED ORDER — PRAZOSIN HCL 1 MG PO CAPS
1.0000 mg | ORAL_CAPSULE | Freq: Every day | ORAL | Status: DC
  Administered 2018-08-22: 1 mg via ORAL
  Filled 2018-08-22: qty 1

## 2018-08-22 MED ORDER — TRAZODONE HCL 50 MG PO TABS
50.0000 mg | ORAL_TABLET | Freq: Once | ORAL | Status: AC
  Administered 2018-08-22: 50 mg via ORAL
  Filled 2018-08-22: qty 1

## 2018-08-22 NOTE — Progress Notes (Signed)
Pt reports he wants to punch a wall, NP Rashaun notified.  [place] notified.

## 2018-08-22 NOTE — Progress Notes (Signed)
Pt signed out Against Medical Advice as per NP Rashaun Dixon.  Family contacted to transport pt home/Scott Manning/ [phone].

## 2018-08-22 NOTE — H&P (Signed)
Behavioral Health Medical Screening Exam  Scott Manning is an 53 y.o. male patient presents as a walk in at [facility] with complaints of worsening anxiety, depression, and  Irritability.  Patient states he was on medication that was started when he was here [name]) that worked well for him.  States that he has been of the Medication for 8 months since he has been back in [place] from [place].  Patient states that the medication really worked for him.  States he is not sure of the name of the medication.  Patient denies suicidal/homicidal ideation; but states he is hearing horns blow; denies paranoia.  "I came cause I been through this before and I wanted to get back on my medicine before it got that bad.  Total Time spent with patient: 30 minutes  Psychiatric Specialty Exam: Physical Exam  Constitutional: He is oriented to person, place, and time. He appears well-developed and well-nourished.  Neck: Normal range of motion.  Respiratory: Effort normal.  Musculoskeletal: Normal range of motion.  Neurological: He is alert and oriented to person, place, and time.  Skin: Skin is warm and dry.  Psychiatric: Judgment normal. His mood appears anxious. His speech is rapid and/or pressured. He is actively hallucinating. Cognition and memory are normal. He exhibits a depressed mood. He expresses no homicidal and no suicidal ideation.    Review of Systems  Psychiatric/Behavioral: Positive for depression and hallucinations. Memory loss: Denies. Substance abuse: Denies. Suicidal ideas: Denies. The patient is nervous/anxious and has insomnia.   All other systems reviewed and are negative.   Blood pressure (!) 146/112, pulse (!) 110, temperature 99.1 F (37.3 C), resp. rate 18.There is no height or weight on file to calculate BMI.  General Appearance: Casual  Eye Contact:  Good  Speech:  Pressured  Volume:  Normal  Mood:  Anxious, Depressed and Irritable  Affect:  Congruent  Thought Process:  Coherent and  Goal Directed  Orientation:  Full (Time, Place, and Person)  Thought Content:  Hallucinations: Auditory  Suicidal Thoughts:  No  Homicidal Thoughts:  No  Memory:  Immediate;   Good Recent;   Good  Judgement:  Intact  Insight:  Present  Psychomotor Activity:  Normal  Concentration: Concentration: Good and Attention Span: Good  Recall:  Good  Fund of Knowledge:Fair  Language: Good  Akathisia:  No  Handed:  Right  AIMS (if indicated):     Assets:  Communication Skills Desire for Improvement Housing Social Support  Sleep:       Musculoskeletal: Strength & Muscle Tone: within normal limits Gait & Station: normal Patient leans: N/A  Blood pressure (!) 146/112, pulse (!) 110, temperature 99.1 F (37.3 C), resp. rate 18.  Recommendations:  Observation 24 hours; reassess tomorrow morning   Based on my evaluation the patient does not appear to have an emergency medical condition.  Shuvon Rankin, NP 08/22/2018, 5:42 PM
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
## 2018-08-22 NOTE — Progress Notes (Signed)
Patient ID: Scott Manning, male   DOB: May 05, 1965, 53 y.o.   MRN: [id number] Pt A&O x 3, no distress, anxious at present. Auditory hallucinations, hearing horns blow.  Cooperative at present.  Monitoring for safety.

## 2018-08-22 NOTE — Progress Notes (Signed)
Pt complaining of increasing anxiety, NP Rashaun notified, will continue to monitor, no further orders given.

## 2018-08-22 NOTE — Progress Notes (Signed)
NP Rashaun at bedside to speak with pt.
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
